# Patient Record
Sex: Male | Born: 1955 | Race: White | Hispanic: No | Marital: Married | Smoking: Former smoker
Health system: Southern US, Community
[De-identification: ages and names within clinical notes are randomized; demographics above are authoritative.]

## PROBLEM LIST (undated history)

## (undated) DIAGNOSIS — E079 Disorder of thyroid, unspecified: Secondary | ICD-10-CM

## (undated) DIAGNOSIS — F419 Anxiety disorder, unspecified: Secondary | ICD-10-CM

## (undated) DIAGNOSIS — N400 Enlarged prostate without lower urinary tract symptoms: Secondary | ICD-10-CM

---

## 2005-02-04 ENCOUNTER — Ambulatory Visit: Payer: Self-pay | Admitting: Internal Medicine

## 2006-03-13 ENCOUNTER — Ambulatory Visit: Payer: Self-pay | Admitting: Internal Medicine

## 2006-03-13 LAB — CONVERTED CEMR LAB: PSA: 1.31 ng/mL

## 2007-07-20 ENCOUNTER — Ambulatory Visit: Payer: Self-pay | Admitting: Internal Medicine

## 2007-07-21 ENCOUNTER — Encounter: Payer: Self-pay | Admitting: Internal Medicine

## 2007-11-04 ENCOUNTER — Encounter: Payer: Self-pay | Admitting: Internal Medicine

## 2008-06-19 ENCOUNTER — Telehealth (INDEPENDENT_AMBULATORY_CARE_PROVIDER_SITE_OTHER): Payer: Self-pay | Admitting: *Deleted

## 2008-08-01 ENCOUNTER — Ambulatory Visit: Payer: Self-pay | Admitting: Internal Medicine

## 2008-08-01 LAB — CONVERTED CEMR LAB
AST: 22 units/L (ref 0–37)
Basophils Absolute: 0 10*3/uL (ref 0.0–0.1)
Basophils Relative: 0.9 % (ref 0.0–3.0)
Chloride: 111 meq/L (ref 96–112)
Cholesterol: 204 mg/dL (ref 0–200)
Creatinine, Ser: 0.9 mg/dL (ref 0.4–1.5)
Direct LDL: 146.6 mg/dL
Eosinophils Absolute: 0.2 10*3/uL (ref 0.0–0.7)
GFR calc Af Amer: 114 mL/min
GFR calc non Af Amer: 95 mL/min
HCT: 42.5 % (ref 39.0–52.0)
HDL: 31.6 mg/dL — ABNORMAL LOW (ref >39.0)
Hemoglobin, Urine: NEGATIVE
Ketones, ur: NEGATIVE mg/dL
MCHC: 34.4 g/dL (ref 30.0–36.0)
MCV: 91.3 fL (ref 78.0–100.0)
Monocytes Absolute: 0.4 10*3/uL (ref 0.1–1.0)
Neutrophils Relative %: 49.8 % (ref 43.0–77.0)
PSA: 2.83 ng/mL (ref 0.10–4.00)
Platelets: 206 10*3/uL (ref 150–400)
RBC: 4.65 M/uL (ref 4.22–5.81)
TSH: 5.95 microintl units/mL — ABNORMAL HIGH (ref 0.35–5.50)
Total Bilirubin: 0.9 mg/dL (ref 0.3–1.2)
Triglycerides: 91 mg/dL (ref 0–149)
Urine Glucose: NEGATIVE mg/dL
Urobilinogen, UA: 0.2 (ref 0.0–1.0)
VLDL: 18 mg/dL (ref 0–40)

## 2008-08-08 ENCOUNTER — Ambulatory Visit: Payer: Self-pay | Admitting: Internal Medicine

## 2008-08-08 DIAGNOSIS — E785 Hyperlipidemia, unspecified: Secondary | ICD-10-CM | POA: Insufficient documentation

## 2008-08-08 DIAGNOSIS — E039 Hypothyroidism, unspecified: Secondary | ICD-10-CM

## 2008-09-19 ENCOUNTER — Telehealth (INDEPENDENT_AMBULATORY_CARE_PROVIDER_SITE_OTHER): Payer: Self-pay | Admitting: *Deleted

## 2008-09-19 ENCOUNTER — Ambulatory Visit: Payer: Self-pay | Admitting: Internal Medicine

## 2008-09-19 LAB — CONVERTED CEMR LAB
Cholesterol: 185 mg/dL (ref 0–200)
LDL Cholesterol: 131 mg/dL — ABNORMAL HIGH (ref 0–99)
Triglycerides: 82 mg/dL (ref 0–149)

## 2009-08-23 ENCOUNTER — Ambulatory Visit: Payer: Self-pay | Admitting: Internal Medicine

## 2009-08-23 LAB — CONVERTED CEMR LAB
ALT: 35 units/L (ref 0–53)
AST: 32 units/L (ref 0–37)
BUN: 15 mg/dL (ref 6–23)
Basophils Relative: 0.6 % (ref 0.0–3.0)
Bilirubin Urine: NEGATIVE
Bilirubin, Direct: 0.1 mg/dL (ref 0.0–0.3)
CO2: 31 meq/L (ref 19–32)
Chloride: 106 meq/L (ref 96–112)
Eosinophils Relative: 6.3 % — ABNORMAL HIGH (ref 0.0–5.0)
Glucose, Bld: 89 mg/dL (ref 70–99)
HDL: 45.1 mg/dL (ref >39.00)
Hemoglobin, Urine: NEGATIVE
LDL Cholesterol: 142 mg/dL — ABNORMAL HIGH (ref 0–99)
Lymphocytes Relative: 36.6 % (ref 12.0–46.0)
Monocytes Absolute: 0.4 10*3/uL (ref 0.1–1.0)
Neutrophils Relative %: 46.9 % (ref 43.0–77.0)
PSA: 1.52 ng/mL (ref 0.10–4.00)
Platelets: 199 10*3/uL (ref 150.0–400.0)
Potassium: 4.6 meq/L (ref 3.5–5.1)
RBC: 4.82 M/uL (ref 4.22–5.81)
Sodium: 141 meq/L (ref 135–145)
Specific Gravity, Urine: 1.015 (ref 1.000–1.030)
TSH: 4.16 microintl units/mL (ref 0.35–5.50)
Total Bilirubin: 0.9 mg/dL (ref 0.3–1.2)
Total CHOL/HDL Ratio: 4
Total Protein, Urine: NEGATIVE mg/dL
Urobilinogen, UA: 0.2 (ref 0.0–1.0)
VLDL: 12.4 mg/dL (ref 0.0–40.0)
WBC: 4.3 10*3/uL — ABNORMAL LOW (ref 4.5–10.5)
pH: 6.5 (ref 5.0–8.0)

## 2009-08-30 ENCOUNTER — Ambulatory Visit: Payer: Self-pay | Admitting: Internal Medicine

## 2010-08-26 ENCOUNTER — Encounter: Payer: Self-pay | Admitting: Family Medicine

## 2010-08-26 ENCOUNTER — Ambulatory Visit: Payer: Self-pay | Admitting: Family Medicine

## 2010-08-26 LAB — CONVERTED CEMR LAB
Glucose, Urine, Semiquant: NEGATIVE
Nitrite: NEGATIVE
Protein, U semiquant: NEGATIVE
Urobilinogen, UA: 0.2
WBC Urine, dipstick: NEGATIVE

## 2010-08-28 LAB — CONVERTED CEMR LAB
ALT: 19 units/L (ref 0–53)
AST: 19 units/L (ref 0–37)
Albumin: 4.1 g/dL (ref 3.5–5.2)
Alkaline Phosphatase: 58 units/L (ref 39–117)
Eosinophils Relative: 6 % — ABNORMAL HIGH (ref 0.0–5.0)
GFR calc non Af Amer: 98.53 mL/min (ref >60)
HCT: 41.3 % (ref 39.0–52.0)
Hemoglobin: 14.2 g/dL (ref 13.0–17.0)
LDL Cholesterol: 120 mg/dL — ABNORMAL HIGH (ref 0–99)
Lymphocytes Relative: 36.4 % (ref 12.0–46.0)
Lymphs Abs: 1.4 10*3/uL (ref 0.7–4.0)
Monocytes Relative: 9.2 % (ref 3.0–12.0)
Neutro Abs: 1.8 10*3/uL (ref 1.4–7.7)
Potassium: 4.2 meq/L (ref 3.5–5.1)
Sodium: 140 meq/L (ref 135–145)
TSH: 2.84 microintl units/mL (ref 0.35–5.50)
Total Bilirubin: 0.4 mg/dL (ref 0.3–1.2)
Total CHOL/HDL Ratio: 4
VLDL: 16 mg/dL (ref 0.0–40.0)
WBC: 3.9 10*3/uL — ABNORMAL LOW (ref 4.5–10.5)

## 2010-09-17 ENCOUNTER — Telehealth: Payer: Self-pay | Admitting: Family Medicine

## 2010-12-31 NOTE — Progress Notes (Signed)
Summary: REFILL REQUEST  Phone Note Refill Request Message from:  Patient on September 17, 2010 10:07 AM  Refills Requested: Medication #1:  LEVOTHYROXINE SODIUM 88 MCG TABS 1 by mouth once daily.   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  Pt states that the last Rx that he received on 9/26 was sent to the wrong pharmacy?[facility]... Pt adv he uses [facility].    Initial call taken by: Debbra Riding,  September 17, 2010 10:07 AM    Prescriptions: LEVOTHYROXINE SODIUM 88 MCG TABS (LEVOTHYROXINE SODIUM) 1 by mouth once daily  #100 x 3   Entered by:   Kern Reap CMA (AAMA)   Authorized by:   Roderick Pee MD   Signed by:   Kern Reap CMA (AAMA) on 09/17/2010   Method used:   Electronically to        [facility]  [phone]* (retail)       [address]       [place], [place]  [address]       Ph: [phone] or [phone]       Fax: [phone]   RxID:   [id number]

## 2010-12-31 NOTE — Assessment & Plan Note (Signed)
Summary: BRAND NEW PT/TO EST/PT REQ CPX/PT FASTING/PER DR Christyana Corwin/CJR   Vital Signs:  Patient profile:   55 year old male Height:      69.5 inches Weight:      174 pounds BMI:     25.42 Temp:     98.0 degrees F oral BP sitting:   108 / 78  (left arm) Cuff size:   regular  Vitals Entered By: Kern Reap CMA Duncan Dull) (August 26, 2010 9:40 AM) CC: new to establish  Is Patient Diabetic? No Pain Assessment Patient in pain? no        CC:  new to establish .  History of Present Illness: Raymond Morrow is a 55 year old, married male, nonsmoker, who comes in today as a new patient having transferred here from the [facility] office for a general physical examination because of underlying hypothyroidism.  He had a history of hyperthyroidism.  He was treated with RAI and is on thyroid 88 micrograms daily.  He's here for annual checkup.  He has recurrent fungal infections in the groin.  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  It always clears up with over-the-counter medication, but then comes back once or twice a year.  He has seen a dermatologist, who told him the same thing.  OTC Rx  Review of systems negative except is not get routine dental care.  Tetanus booster unknown.  Will give him a booster today.  He is a personal friend of Geophysicist/field seismologist & Management  Hep-HIV-STD-Contraception     Dental Visit-last 6 months no  Allergies (verified): No Known Drug Allergies  Past History:  Past medical, surgical, family and social histories (including risk factors) reviewed, and no changes noted (except as noted below).  Past Medical History: Reviewed history from 08/08/2008 and no changes required. 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 Inguinal herniorrhaphy - right  Family History: Reviewed history from 08/08/2008 and  no changes required. sister with hyperthyrodism mother with DM brother deceased - AIDS  Social History: Reviewed history from 08/08/2008 and no changes required. Married self-employed Company secretary Former Smoker Alcohol use-yes 3 children Dental Care w/in 6 mos.:  no  Review of Systems      See HPI  Physical Exam  General:  Well-developed,well-nourished,in no acute distress; alert,appropriate and cooperative throughout examination Head:  Normocephalic and atraumatic without obvious abnormalities. No apparent alopecia or balding. Eyes:  No corneal or conjunctival inflammation noted. EOMI. Perrla. Funduscopic exam benign, without hemorrhages, exudates or papilledema. Vision grossly normal. Ears:  External ear exam shows no significant lesions or deformities.  Otoscopic examination reveals clear canals, tympanic membranes are intact bilaterally without bulging, retraction, inflammation or discharge. Hearing is grossly normal bilaterally. Nose:  External nasal examination shows no deformity or inflammation. Nasal mucosa are pink and moist without lesions or exudates. Mouth:  Oral mucosa and oropharynx without lesions or exudates.  Teeth in good repair. Neck:  No deformities, masses, or tenderness noted. Chest Wall:  No deformities, masses, tenderness or gynecomastia noted. Breasts:  No masses or gynecomastia noted Lungs:  Normal respiratory effort, chest expands symmetrically. Lungs are clear to auscultation, no crackles or wheezes. Heart:  Normal rate and regular rhythm. S1 and S2 normal without gallop, murmur, click, rub or other extra sounds. Abdomen:  Bowel sounds positive,abdomen soft and non-tender without masses, organomegaly or hernias noted. Rectal:  No external abnormalities noted.  Normal sphincter tone. No rectal masses or tenderness. Genitalia:  Testes bilaterally descended without nodularity, tenderness or masses. No scrotal masses or lesions. No penis lesions or urethral  discharge. Prostate:  Prostate gland firm and smooth, no enlargement, nodularity, tenderness, mass, asymmetry or induration. Msk:  No deformity or scoliosis noted of thoracic or lumbar spine.   Pulses:  R and L carotid,radial,femoral,dorsalis pedis and posterior tibial pulses are full and equal bilaterally Extremities:  No clubbing, cyanosis, edema, or deformity noted with normal full range of motion of all joints.   Neurologic:  No cranial nerve deficits noted. Station and gait are normal. Plantar reflexes are down-going bilaterally. DTRs are symmetrical throughout. Sensory, motor and coordinative functions appear intact. Skin:  total body skin exam normal except for scar right groin from previous hernia surgery Cervical Nodes:  No lymphadenopathy noted Axillary Nodes:  No palpable lymphadenopathy Inguinal Nodes:  No significant adenopathy Psych:  Cognition and judgment appear intact. Alert and cooperative with normal attention span and concentration. No apparent delusions, illusions, hallucinations   Impression & Recommendations:  Problem # 1:  PREVENTIVE HEALTH CARE (ICD-V70.0) Assessment New  Orders: Venipuncture (16109) TLB-Lipid Panel (80061-LIPID) TLB-BMP (Basic Metabolic Panel-BMET) (80048-METABOL) TLB-CBC Platelet - w/Differential (85025-CBCD) TLB-Hepatic/Liver Function Pnl (80076-HEPATIC) TLB-TSH (Thyroid Stimulating Hormone) (84443-TSH) TLB-PSA (Prostate Specific Antigen) (84153-PSA) Prescription Created Electronically [id number]) UA Dipstick w/o Micro (automated)  (81003) EKG w/ Interpretation (93000) Specimen Handling (09811)  Problem # 2:  HYPOTHYROIDISM (ICD-244.9) Assessment: New  His updated medication list for this problem includes:    Levothyroxine Sodium 88 Mcg Tabs (Levothyroxine sodium) .Marland Kitchen... 1 by mouth once daily  His updated medication list for this problem includes:    Levothyroxine Sodium 88 Mcg Tabs (Levothyroxine sodium) .Marland Kitchen... 1 by mouth once  daily  Orders: Venipuncture (91478) TLB-Lipid Panel (80061-LIPID) TLB-BMP (Basic Metabolic Panel-BMET) (80048-METABOL) TLB-CBC Platelet - w/Differential (85025-CBCD) TLB-Hepatic/Liver Function Pnl (80076-HEPATIC) TLB-TSH (Thyroid Stimulating Hormone) (84443-TSH) TLB-PSA (Prostate Specific Antigen) (84153-PSA) Prescription Created Electronically [phone]) UA Dipstick w/o Micro (automated)  (81003)  Complete Medication List: 1)  Levothyroxine Sodium 88 Mcg Tabs (Levothyroxine sodium) .Marland Kitchen.. 1 by mouth once daily  Other Orders: Tdap => 34yrs IM (13086) Admin 1st Vaccine (57846)  Patient Instructions: 1)  Please schedule a follow-up appointment in 1 year. 2)  Take an Aspirin every day. 3)  Motrin 600 mg twice daily with food for joint pain. 4)  We will call you when we get your lab work back Prescriptions: LEVOTHYROXINE SODIUM 88 MCG TABS (LEVOTHYROXINE SODIUM) 1 by mouth once daily  #100 x 3   Entered and Authorized by:   Roderick Pee MD   Signed by:   Roderick Pee MD on 08/26/2010   Method used:   Electronically to        [facility]. [phone]* (retail)       [phone] [address]       [place], [place]  [address]       Ph: [phone]       Fax: [phone]   RxID:   [id number]    Immunizations Administered:  Tetanus Vaccine:    Vaccine Type: Tdap    Site: left deltoid    Mfr: GlaxoSmithKline    Dose: 0.5 ml    Route: IM    Given by: Kern Reap CMA (AAMA)    Exp. Date: 09/19/2012    Lot #: [id number]    Physician counseled: yes   Laboratory Results  Urine Tests    Routine Urinalysis   Color: yellow Appearance: Clear Glucose: negative   (Normal Range: Negative) Bilirubin: negative   (Normal Range: Negative) Ketone: negative   (Normal Range: Negative) Spec. Gravity: <1.005   (Normal Range: 1.003-1.035) Blood: negative   (Normal Range: Negative) pH: 5.0   (Normal Range: 5.0-8.0) Protein: negative   (Normal Range:  Negative) Urobilinogen: 0.2   (Normal Range: 0-1) Nitrite: negative   (Normal Range: Negative) Leukocyte Esterace: negative   (Normal Range: Negative)    Comments: Rita Ohara  August 26, 2010 11:07 AM

## 2011-10-16 ENCOUNTER — Telehealth: Payer: Self-pay | Admitting: Family Medicine

## 2011-10-16 MED ORDER — LEVOTHYROXINE SODIUM 88 MCG PO CAPS: 88.0000 ug | ORAL_CAPSULE | Freq: Every day | ORAL | Status: DC

## 2011-10-16 NOTE — Telephone Encounter (Signed)
Pt requesting refill on LEVOTHYROXINE SODIUM 88 MCG TABS 1 by mouth once daily. Pt is scheduled 11/17/11 for cpx but will be out of meds before then   [facility]

## 2011-10-18 ENCOUNTER — Other Ambulatory Visit: Payer: Self-pay | Admitting: Family Medicine

## 2011-10-20 ENCOUNTER — Telehealth: Payer: Self-pay | Admitting: Family Medicine

## 2011-10-20 NOTE — Telephone Encounter (Signed)
FYI... Pt wants his pharmacy to be [facility] [address] not [facility]

## 2011-10-20 NOTE — Telephone Encounter (Signed)
noted 

## 2011-11-06 ENCOUNTER — Other Ambulatory Visit (INDEPENDENT_AMBULATORY_CARE_PROVIDER_SITE_OTHER): Payer: BC Managed Care – PPO

## 2011-11-06 DIAGNOSIS — Z0000 Encounter for general adult medical examination without abnormal findings: Secondary | ICD-10-CM

## 2011-11-06 LAB — LIPID PANEL
LDL Cholesterol: 130 mg/dL — ABNORMAL HIGH (ref 0–99)
Total CHOL/HDL Ratio: 4
Triglycerides: 42 mg/dL (ref 0.0–149.0)
VLDL: 8.4 mg/dL (ref 0.0–40.0)

## 2011-11-06 LAB — BASIC METABOLIC PANEL
Calcium: 8.9 mg/dL (ref 8.4–10.5)
Creatinine, Ser: 0.9 mg/dL (ref 0.4–1.5)
GFR: 94.28 mL/min (ref >60.00)

## 2011-11-06 LAB — POCT URINALYSIS DIPSTICK
Bilirubin, UA: NEGATIVE
Glucose, UA: NEGATIVE
Leukocytes, UA: NEGATIVE
Nitrite, UA: NEGATIVE

## 2011-11-06 LAB — CBC WITH DIFFERENTIAL/PLATELET
Basophils Relative: 1 % (ref 0.0–3.0)
Eosinophils Relative: 4.7 % (ref 0.0–5.0)
Lymphocytes Relative: 35.4 % (ref 12.0–46.0)
Neutrophils Relative %: 50.9 % (ref 43.0–77.0)
Platelets: 188 10*3/uL (ref 150.0–400.0)
RBC: 4.39 Mil/uL (ref 4.22–5.81)
WBC: 3.4 10*3/uL — ABNORMAL LOW (ref 4.5–10.5)

## 2011-11-06 LAB — HEPATIC FUNCTION PANEL
ALT: 25 U/L (ref 0–53)
Alkaline Phosphatase: 58 U/L (ref 39–117)
Bilirubin, Direct: 0.1 mg/dL (ref 0.0–0.3)
Total Bilirubin: 0.6 mg/dL (ref 0.3–1.2)
Total Protein: 6.6 g/dL (ref 6.0–8.3)

## 2011-11-17 ENCOUNTER — Encounter: Payer: Self-pay | Admitting: Family Medicine

## 2011-11-17 ENCOUNTER — Ambulatory Visit (INDEPENDENT_AMBULATORY_CARE_PROVIDER_SITE_OTHER): Payer: BC Managed Care – PPO | Admitting: Family Medicine

## 2011-11-17 VITALS — BP 102/64 | Temp 98.0°F | Ht 69.5 in | Wt 173.0 lb

## 2011-11-17 DIAGNOSIS — E039 Hypothyroidism, unspecified: Secondary | ICD-10-CM

## 2011-11-17 DIAGNOSIS — Z0000 Encounter for general adult medical examination without abnormal findings: Secondary | ICD-10-CM

## 2011-11-17 MED ORDER — LEVOTHYROXINE SODIUM 88 MCG PO TABS: 88.0000 ug | ORAL_TABLET | Freq: Every day | ORAL | Status: DC

## 2011-11-17 NOTE — Progress Notes (Signed)
  Subjective:    Patient ID: Raymond Morrow, male    DOB: 12-21-1955, 55 y.o.   MRN: [id number]  HPI Raymond Morrow is a 55 year old, married man nonsmoker, who comes in today for a general physical examination because of a history of hypo-thyroidism.  A fairly taking Synthroid 88 mcg daily.  Tetanus booster 2011, he declines the flu shot   Review of Systems  Constitutional: Negative.   HENT: Positive for congestion.   Eyes: Negative.   Respiratory: Negative.   Cardiovascular: Negative.   Gastrointestinal: Negative.   Genitourinary: Negative.   Musculoskeletal: Negative.   Skin: Negative.   Neurological: Negative.   Hematological: Negative.   Psychiatric/Behavioral: Negative.        Objective:   Physical Exam  Constitutional: He is oriented to person, place, and time. He appears well-developed and well-nourished.  HENT:  Head: Normocephalic and atraumatic.  Right Ear: External ear normal.  Left Ear: External ear normal.  Nose: Nose normal.  Mouth/Throat: Oropharynx is clear and moist.  Eyes: Conjunctivae and EOM are normal. Pupils are equal, round, and reactive to light.  Neck: Normal range of motion. Neck supple. No JVD present. No tracheal deviation present. No thyromegaly present.  Cardiovascular: Normal rate, regular rhythm, normal heart sounds and intact distal pulses.  Exam reveals no gallop and no friction rub.   No murmur heard. Pulmonary/Chest: Effort normal and breath sounds normal. No stridor. No respiratory distress. He has no wheezes. He has no rales. He exhibits no tenderness.  Abdominal: Soft. Bowel sounds are normal. He exhibits no distension and no mass. There is no tenderness. There is no rebound and no guarding.  Genitourinary: Rectum normal, prostate normal and penis normal. Guaiac negative stool. No penile tenderness.  Musculoskeletal: Normal range of motion. He exhibits no edema and no tenderness.  Lymphadenopathy:    He has no cervical adenopathy.    Neurological: He is alert and oriented to person, place, and time. He has normal reflexes. No cranial nerve deficit. He exhibits normal muscle tone.  Skin: Skin is warm and dry. No rash noted. No erythema. No pallor.  Psychiatric: He has a normal mood and affect. His behavior is normal. Judgment and thought content normal.          Assessment & Plan:  Healthy male   Hypothyroidism plan continue Synthroid.  Follow-up in one year.  Hearing loss.  Recommend audiogram

## 2011-11-17 NOTE — Patient Instructions (Signed)
Continue your current thyroid dose.  I would recommend u  [facility]  Return p.r.n. One year

## 2012-12-09 ENCOUNTER — Other Ambulatory Visit: Payer: Self-pay | Admitting: Family Medicine

## 2013-04-24 ENCOUNTER — Other Ambulatory Visit: Payer: Self-pay | Admitting: Family Medicine

## 2013-05-09 ENCOUNTER — Ambulatory Visit (INDEPENDENT_AMBULATORY_CARE_PROVIDER_SITE_OTHER): Payer: BC Managed Care – PPO | Admitting: Family Medicine

## 2013-05-09 ENCOUNTER — Encounter: Payer: Self-pay | Admitting: Family Medicine

## 2013-05-09 VITALS — BP 110/70 | Temp 98.4°F | Wt 175.0 lb

## 2013-05-09 DIAGNOSIS — E039 Hypothyroidism, unspecified: Secondary | ICD-10-CM

## 2013-05-09 DIAGNOSIS — E785 Hyperlipidemia, unspecified: Secondary | ICD-10-CM

## 2013-05-09 LAB — CBC WITH DIFFERENTIAL/PLATELET
Basophils Absolute: 0 10*3/uL (ref 0.0–0.1)
Lymphocytes Relative: 14.9 % (ref 12.0–46.0)
Lymphs Abs: 0.9 10*3/uL (ref 0.7–4.0)
Monocytes Relative: 12.9 % — ABNORMAL HIGH (ref 3.0–12.0)
Platelets: 209 10*3/uL (ref 150.0–400.0)
RDW: 13.2 % (ref 11.5–14.6)

## 2013-05-09 LAB — BASIC METABOLIC PANEL
CO2: 28 mEq/L (ref 19–32)
Chloride: 106 mEq/L (ref 96–112)
Creatinine, Ser: 0.8 mg/dL (ref 0.4–1.5)
Glucose, Bld: 80 mg/dL (ref 70–99)

## 2013-05-09 LAB — POCT URINALYSIS DIPSTICK
Bilirubin, UA: NEGATIVE
Leukocytes, UA: NEGATIVE
Nitrite, UA: NEGATIVE
Urobilinogen, UA: 0.2
pH, UA: 5.5

## 2013-05-09 LAB — HEPATIC FUNCTION PANEL
ALT: 22 U/L (ref 0–53)
AST: 18 U/L (ref 0–37)
Albumin: 3.8 g/dL (ref 3.5–5.2)

## 2013-05-09 LAB — PSA: PSA: 1.96 ng/mL (ref 0.10–4.00)

## 2013-05-09 LAB — TSH: TSH: 8.93 u[IU]/mL — ABNORMAL HIGH (ref 0.35–5.50)

## 2013-05-09 LAB — LIPID PANEL
HDL: 40.8 mg/dL (ref >39.00)
Triglycerides: 184 mg/dL — ABNORMAL HIGH (ref 0.0–149.0)

## 2013-05-09 MED ORDER — LEVOTHYROXINE SODIUM 88 MCG PO TABS: ORAL_TABLET | ORAL | Status: DC

## 2013-05-09 NOTE — Progress Notes (Signed)
  Subjective:    Patient ID: Raymond Morrow, male    DOB: 1956-09-24, 58 y.o.   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 He recently got back from the [place] and has had 3-4 loose bowel movements over the last 2 days. He has no fever chills or vomiting   Review of Systems    well-developed review of systems negative Objective:   Physical Exam Well-developed well nourished male no acute distress examination neck shows a nonpalpable thyroid       Assessment & Plan:  History of hypothyroidism corrected with Synthroid 88 mcg daily check labs and set up CPX

## 2013-05-09 NOTE — Patient Instructions (Signed)
Continue your current dose of Synthroid 88 mcg daily  Labs today,,,,,,,,,,,,, we will call you the report tomorrow  Set up a physical exam sometime in July,,,,,,,,,,,,,,,,,,,,,,,, all your lab work today so you won't need any lab work prior your physical

## 2013-06-13 ENCOUNTER — Encounter: Payer: Self-pay | Admitting: Family Medicine

## 2013-06-13 ENCOUNTER — Ambulatory Visit (INDEPENDENT_AMBULATORY_CARE_PROVIDER_SITE_OTHER): Payer: BC Managed Care – PPO | Admitting: Family Medicine

## 2013-06-13 ENCOUNTER — Telehealth: Payer: Self-pay | Admitting: Family Medicine

## 2013-06-13 VITALS — BP 110/80 | Temp 98.3°F | Ht 70.25 in | Wt 176.0 lb

## 2013-06-13 DIAGNOSIS — E039 Hypothyroidism, unspecified: Secondary | ICD-10-CM

## 2013-06-13 DIAGNOSIS — Z0000 Encounter for general adult medical examination without abnormal findings: Secondary | ICD-10-CM

## 2013-06-13 DIAGNOSIS — H833X9 Noise effects on inner ear, unspecified ear: Secondary | ICD-10-CM | POA: Insufficient documentation

## 2013-06-13 DIAGNOSIS — H833X3 Noise effects on inner ear, bilateral: Secondary | ICD-10-CM

## 2013-06-13 DIAGNOSIS — N400 Enlarged prostate without lower urinary tract symptoms: Secondary | ICD-10-CM | POA: Insufficient documentation

## 2013-06-13 DIAGNOSIS — E785 Hyperlipidemia, unspecified: Secondary | ICD-10-CM

## 2013-06-13 DIAGNOSIS — F411 Generalized anxiety disorder: Secondary | ICD-10-CM

## 2013-06-13 MED ORDER — LEVOTHYROXINE SODIUM 100 MCG PO TABS: 100.0000 ug | ORAL_TABLET | Freq: Every day | ORAL | Status: DC

## 2013-06-13 NOTE — Patient Instructions (Addendum)
Increase the Synthroid to 100 mcg daily  Followup TSH level in 3 months  Continue good health habits  Return in one year for general physical examination  In the morning clean your eyes with a warm washcloth and use clear eyes........ One drop in each eye 3 times daily when necessary  I would recommend [facility] for hearing aid evaluation

## 2013-06-13 NOTE — Progress Notes (Signed)
  Subjective:    Patient ID: Raymond Morrow, male    DOB: Dec 28, 1955, 57 y.o.   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 His TSH level is 8.93. He states he's taking his medication on a daily basis and has not skipped a dose. We will therefore increase his Synthroid to 100 mcg daily  He gets routine eye care, dental care, colonoscopy 2 years ago normal, vaccinations up-to-date  He says he has trouble with mucus in his eyes. He may have some underlying allergy  He's also noticed decreased hearing. 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 I recommended [facility] for hearing aid evaluation  Family history unchanged mother is 25 alive and well dad died in his 31 from alcoholism 2 brothers and 2 sisters one brother died of AIDS the other 3 are in excellent health   Review of Systems  Constitutional: Negative.   HENT: Negative.   Eyes: Negative.   Respiratory: Negative.   Cardiovascular: Negative.   Gastrointestinal: Negative.   Genitourinary: Negative.   Musculoskeletal: Negative.   Skin: Negative.   Neurological: Negative.   Psychiatric/Behavioral: Negative.        Objective:   Physical Exam  Constitutional: He is oriented to person, place, and time. He appears well-developed and well-nourished.  HENT:  Head: Normocephalic and atraumatic.  Right Ear: External ear normal.  Left Ear: External ear normal.  Nose: Nose normal.  Mouth/Throat: Oropharynx is clear and moist.  Eyes: Conjunctivae and EOM are normal. Pupils are equal, round, and reactive to light.  Neck: Normal range of motion. Neck supple. No JVD present. No tracheal deviation present. No thyromegaly present.  Cardiovascular: Normal rate, regular rhythm, normal heart sounds and intact distal pulses.  Exam  reveals no gallop and no friction rub.   No murmur heard. Pulmonary/Chest: Effort normal and breath sounds normal. No stridor. No respiratory distress. He has no wheezes. He has no rales. He exhibits no tenderness.  Abdominal: Soft. Bowel sounds are normal. He exhibits no distension and no mass. There is no tenderness. There is no rebound and no guarding.  Genitourinary: Rectum normal and penis normal. Guaiac negative stool. No penile tenderness.  1+ BPH symmetrical  Musculoskeletal: Normal range of motion. He exhibits no edema and no tenderness.  Lymphadenopathy:    He has no cervical adenopathy.  Neurological: He is alert and oriented to person, place, and time. He has normal reflexes. No cranial nerve deficit. He exhibits normal muscle tone.  Skin: Skin is warm and dry. No rash noted. No erythema. No pallor.  Total body skin exam normal  Psychiatric: He has a normal mood and affect. His behavior is normal. Judgment and thought content normal.          Assessment & Plan:  Healthy male  History of hypothyroidism increase Synthroid 100 mcg daily  Allergic eyes prescribe treatment program  Hearing loss recommended hearing aid evaluation

## 2013-06-13 NOTE — Telephone Encounter (Signed)
Left detailed message on machine for patient 

## 2013-06-13 NOTE — Telephone Encounter (Signed)
In the future usually a complete 12 hour fast you can add nothing to your drink after midnight and come in a.m. Or you can have a light breakfast skip lunch and come in the afternoon

## 2013-06-13 NOTE — Telephone Encounter (Signed)
Pt states he did NOT have to fast this year for his CPX. Pt would like to know if he still would not have to fast next year. Pls advise.

## 2013-08-19 ENCOUNTER — Ambulatory Visit: Payer: BC Managed Care – PPO | Admitting: Internal Medicine

## 2013-08-19 ENCOUNTER — Ambulatory Visit (INDEPENDENT_AMBULATORY_CARE_PROVIDER_SITE_OTHER): Payer: BC Managed Care – PPO | Admitting: Family Medicine

## 2013-08-19 ENCOUNTER — Encounter: Payer: Self-pay | Admitting: Family Medicine

## 2013-08-19 VITALS — BP 110/70 | HR 60 | Wt 171.0 lb

## 2013-08-19 DIAGNOSIS — T22211A Burn of second degree of right forearm, initial encounter: Secondary | ICD-10-CM

## 2013-08-19 DIAGNOSIS — T22219A Burn of second degree of unspecified forearm, initial encounter: Secondary | ICD-10-CM

## 2013-08-19 MED ORDER — CEPHALEXIN 500 MG PO CAPS: 500.0000 mg | ORAL_CAPSULE | Freq: Three times a day (TID) | ORAL | Status: DC

## 2013-08-19 NOTE — Patient Instructions (Addendum)
Cellulitis Cellulitis is an infection of the skin and the tissue beneath it. The infected area is usually red and tender. Cellulitis occurs most often in the arms and lower legs.  CAUSES  Cellulitis is caused by bacteria that enter the skin through cracks or cuts in the skin. The most common types of bacteria that cause cellulitis are Staphylococcus and Streptococcus. SYMPTOMS   Redness and warmth.  Swelling.  Tenderness or pain.  Fever. DIAGNOSIS  Your caregiver can usually determine what is wrong based on a physical exam. Blood tests may also be done. TREATMENT  Treatment usually involves taking an antibiotic medicine. HOME CARE INSTRUCTIONS   Take your antibiotics as directed. Finish them even if you start to feel better.  Keep the infected arm or leg elevated to reduce swelling.  Apply a warm cloth to the affected area up to 4 times per day to relieve pain.  Only take over-the-counter or prescription medicines for pain, discomfort, or fever as directed by your caregiver.  Keep all follow-up appointments as directed by your caregiver. SEEK MEDICAL CARE IF:   You notice red streaks coming from the infected area.  Your red area gets larger or turns dark in color.  Your bone or joint underneath the infected area becomes painful after the skin has healed.  Your infection returns in the same area or another area.  You notice a swollen bump in the infected area.  You develop new symptoms. SEEK IMMEDIATE MEDICAL CARE IF:   You have a fever.  You feel very sleepy.  You develop vomiting or diarrhea.  You have a general ill feeling (malaise) with muscle aches and pains. MAKE SURE YOU:   Understand these instructions.  Will watch your condition.  Will get help right away if you are not doing well or get worse. Document Released: 08/27/2005 Document Revised: 05/18/2012 Document Reviewed: 02/02/2012 Exit[employer] Patient Information 2014 [employer].  
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
## 2013-09-13 ENCOUNTER — Other Ambulatory Visit (INDEPENDENT_AMBULATORY_CARE_PROVIDER_SITE_OTHER): Payer: BC Managed Care – PPO

## 2013-09-13 DIAGNOSIS — E039 Hypothyroidism, unspecified: Secondary | ICD-10-CM

## 2013-10-06 ENCOUNTER — Other Ambulatory Visit: Payer: Self-pay

## 2014-05-26 ENCOUNTER — Other Ambulatory Visit: Payer: Self-pay | Admitting: Family Medicine

## 2014-06-14 ENCOUNTER — Encounter: Payer: Self-pay | Admitting: Family Medicine

## 2014-06-14 ENCOUNTER — Ambulatory Visit (INDEPENDENT_AMBULATORY_CARE_PROVIDER_SITE_OTHER): Payer: BC Managed Care – PPO | Admitting: Family Medicine

## 2014-06-14 VITALS — BP 120/80 | Temp 97.8°F | Ht 70.25 in | Wt 173.0 lb

## 2014-06-14 DIAGNOSIS — Z0000 Encounter for general adult medical examination without abnormal findings: Secondary | ICD-10-CM

## 2014-06-14 DIAGNOSIS — E039 Hypothyroidism, unspecified: Secondary | ICD-10-CM

## 2014-06-14 DIAGNOSIS — N400 Enlarged prostate without lower urinary tract symptoms: Secondary | ICD-10-CM

## 2014-06-14 LAB — LIPID PANEL
Cholesterol: 189 mg/dL (ref 0–200)
HDL: 51.3 mg/dL (ref >39.00)
LDL Cholesterol: 127 mg/dL — ABNORMAL HIGH (ref 0–99)
NONHDL: 137.7
Total CHOL/HDL Ratio: 4
Triglycerides: 54 mg/dL (ref 0.0–149.0)
VLDL: 10.8 mg/dL (ref 0.0–40.0)

## 2014-06-14 LAB — POCT URINALYSIS DIPSTICK
Bilirubin, UA: NEGATIVE
Glucose, UA: NEGATIVE
Ketones, UA: NEGATIVE
LEUKOCYTES UA: NEGATIVE
NITRITE UA: NEGATIVE
PH UA: 5.5
PROTEIN UA: NEGATIVE
RBC UA: NEGATIVE
Spec Grav, UA: 1.015
Urobilinogen, UA: 0.2

## 2014-06-14 LAB — CBC WITH DIFFERENTIAL/PLATELET
BASOS ABS: 0 10*3/uL (ref 0.0–0.1)
Basophils Relative: 0.6 % (ref 0.0–3.0)
Eosinophils Absolute: 0.2 10*3/uL (ref 0.0–0.7)
Eosinophils Relative: 4.4 % (ref 0.0–5.0)
HEMATOCRIT: 42.7 % (ref 39.0–52.0)
Hemoglobin: 14.3 g/dL (ref 13.0–17.0)
LYMPHS ABS: 1.4 10*3/uL (ref 0.7–4.0)
Lymphocytes Relative: 38.4 % (ref 12.0–46.0)
MCHC: 33.4 g/dL (ref 30.0–36.0)
MCV: 91.9 fl (ref 78.0–100.0)
MONO ABS: 0.3 10*3/uL (ref 0.1–1.0)
Monocytes Relative: 9.5 % (ref 3.0–12.0)
NEUTROS ABS: 1.7 10*3/uL (ref 1.4–7.7)
Neutrophils Relative %: 47.1 % (ref 43.0–77.0)
Platelets: 211 10*3/uL (ref 150.0–400.0)
RBC: 4.65 Mil/uL (ref 4.22–5.81)
RDW: 13.6 % (ref 11.5–15.5)
WBC: 3.5 10*3/uL — ABNORMAL LOW (ref 4.0–10.5)

## 2014-06-14 LAB — BASIC METABOLIC PANEL
BUN: 16 mg/dL (ref 6–23)
CO2: 27 mEq/L (ref 19–32)
CREATININE: 0.9 mg/dL (ref 0.4–1.5)
Calcium: 9 mg/dL (ref 8.4–10.5)
Chloride: 104 mEq/L (ref 96–112)
GFR: 93.4 mL/min (ref >60.00)
GLUCOSE: 96 mg/dL (ref 70–99)
Potassium: 4.1 mEq/L (ref 3.5–5.1)
Sodium: 136 mEq/L (ref 135–145)

## 2014-06-14 LAB — HEPATIC FUNCTION PANEL
ALBUMIN: 4 g/dL (ref 3.5–5.2)
ALK PHOS: 53 U/L (ref 39–117)
ALT: 18 U/L (ref 0–53)
AST: 18 U/L (ref 0–37)
BILIRUBIN DIRECT: 0.1 mg/dL (ref 0.0–0.3)
Total Bilirubin: 0.7 mg/dL (ref 0.2–1.2)
Total Protein: 6.5 g/dL (ref 6.0–8.3)

## 2014-06-14 LAB — PSA: PSA: 1.29 ng/mL (ref 0.10–4.00)

## 2014-06-14 LAB — TSH: TSH: 1.88 u[IU]/mL (ref 0.35–4.50)

## 2014-06-14 MED ORDER — LEVOTHYROXINE SODIUM 100 MCG PO TABS: ORAL_TABLET | ORAL | Status: DC

## 2014-06-14 NOTE — Progress Notes (Signed)
   Subjective:    Patient ID: Raymond Morrow, male    DOB: 1956-03-03, 58 y.o.   MRN: [id number]  HPI Raymond Morrow is a 58 year old married male nonsmoker,,,,,,, went to [employer],,,,,, who comes in today for general physical examination because of a history of hypothyroidism  He's on Synthroid 100 mcg daily and doing well  He relates some difficulty in [facility] school [employer] student trouble with focus and concentration which is carried into his adult life. I offered him options of testing trial of medication etc. he's got sick about it.  He gets routine eye care, dental care, colonoscopy in his early 5850s normal, tetanus 2011   Review of Systems  Constitutional: Negative.   HENT: Negative.   Eyes: Negative.   Respiratory: Negative.   Cardiovascular: Negative.   Gastrointestinal: Negative.   Genitourinary: Negative.   Musculoskeletal: Negative.   Skin: Negative.   Neurological: Negative.   Psychiatric/Behavioral: Negative.        Objective:   Physical Exam  Nursing note and vitals reviewed. Constitutional: He is oriented to person, place, and time. He appears well-developed and well-nourished.  HENT:  Head: Normocephalic and atraumatic.  Right Ear: External ear normal.  Left Ear: External ear normal.  Nose: Nose normal.  Mouth/Throat: Oropharynx is clear and moist.  Eyes: Conjunctivae and EOM are normal. Pupils are equal, round, and reactive to light.  Neck: Normal range of motion. Neck supple. No JVD present. No tracheal deviation present. No thyromegaly present.  Cardiovascular: Normal rate, regular rhythm, normal heart sounds and intact distal pulses.  Exam reveals no gallop and no friction rub.   No murmur heard. Pulmonary/Chest: Effort normal and breath sounds normal. No stridor. No respiratory distress. He has no wheezes. He has no rales. He exhibits no tenderness.  Abdominal: Soft. Bowel sounds are normal. He exhibits no distension and no mass. There is no tenderness.  There is no rebound and no guarding.  Genitourinary: Rectum normal and penis normal. Guaiac negative stool. No penile tenderness.  2+ symmetrical nodular BPH  Musculoskeletal: Normal range of motion. He exhibits no edema and no tenderness.  Lymphadenopathy:    He has no cervical adenopathy.  Neurological: He is alert and oriented to person, place, and time. He has normal reflexes. No cranial nerve deficit. He exhibits normal muscle tone.  Skin: Skin is warm and dry. No rash noted. No erythema. No pallor.  Psychiatric: He has a normal mood and affect. His behavior is normal. Judgment and thought content normal.          Assessment & Plan:  Healthy male  Hypothyroidism continue Synthroid  BPH asymptomatic......Marland Kitchen. monitor labs  Symptoms of the adult ADD,,,,,, options explained to the patient.

## 2014-06-14 NOTE — Patient Instructions (Signed)
Continue your current medication  Followup in 1 year sooner if any problems

## 2014-06-14 NOTE — Progress Notes (Signed)
Pre visit review using our clinic review tool, if applicable. No additional management support is needed unless otherwise documented below in the visit note. 

## 2015-05-23 ENCOUNTER — Telehealth: Payer: Self-pay | Admitting: Family Medicine

## 2015-05-23 DIAGNOSIS — E039 Hypothyroidism, unspecified: Secondary | ICD-10-CM

## 2015-05-23 MED ORDER — LEVOTHYROXINE SODIUM 100 MCG PO TABS
ORAL_TABLET | ORAL | Status: DC
Start: 2015-05-23 — End: 2015-08-27

## 2015-05-23 NOTE — Telephone Encounter (Signed)
Rx sent 

## 2015-05-23 NOTE — Telephone Encounter (Signed)
Pt is aware.  

## 2015-05-23 NOTE — Telephone Encounter (Signed)
Pt has a cpx sch w/dr todd on 08-27-15. Pt needs refill on levothyroxine 100 mg #90 sent to [facility] [address]

## 2015-06-18 ENCOUNTER — Emergency Department (HOSPITAL_COMMUNITY): Payer: BLUE CROSS/BLUE SHIELD

## 2015-06-18 ENCOUNTER — Encounter (HOSPITAL_COMMUNITY): Payer: Self-pay | Admitting: Emergency Medicine

## 2015-06-18 ENCOUNTER — Emergency Department (HOSPITAL_COMMUNITY)
Admission: EM | Admit: 2015-06-18 | Discharge: 2015-06-19 | Disposition: A | Discharge status: Home / Self Care | Payer: BLUE CROSS/BLUE SHIELD | Attending: Emergency Medicine | Admitting: Emergency Medicine

## 2015-06-18 DIAGNOSIS — Z7982 Long term (current) use of aspirin: Secondary | ICD-10-CM | POA: Diagnosis not present

## 2015-06-18 DIAGNOSIS — R079 Chest pain, unspecified: Secondary | ICD-10-CM | POA: Diagnosis present

## 2015-06-18 DIAGNOSIS — Z8659 Personal history of other mental and behavioral disorders: Secondary | ICD-10-CM | POA: Diagnosis not present

## 2015-06-18 DIAGNOSIS — Z87448 Personal history of other diseases of urinary system: Secondary | ICD-10-CM | POA: Diagnosis not present

## 2015-06-18 DIAGNOSIS — Z87891 Personal history of nicotine dependence: Secondary | ICD-10-CM | POA: Diagnosis not present

## 2015-06-18 DIAGNOSIS — Z79899 Other long term (current) drug therapy: Secondary | ICD-10-CM | POA: Diagnosis not present

## 2015-06-18 DIAGNOSIS — R0789 Other chest pain: Secondary | ICD-10-CM

## 2015-06-18 DIAGNOSIS — E039 Hypothyroidism, unspecified: Secondary | ICD-10-CM | POA: Insufficient documentation

## 2015-06-18 HISTORY — DX: Anxiety disorder, unspecified: F41.9

## 2015-06-18 HISTORY — DX: Benign prostatic hyperplasia without lower urinary tract symptoms: N40.0

## 2015-06-18 HISTORY — DX: Disorder of thyroid, unspecified: E07.9

## 2015-06-18 LAB — BASIC METABOLIC PANEL
Anion gap: 7 (ref 5–15)
BUN: 11 mg/dL (ref 6–20)
CO2: 28 mmol/L (ref 22–32)
Calcium: 9.1 mg/dL (ref 8.9–10.3)
Chloride: 106 mmol/L (ref 101–111)
Creatinine, Ser: 0.99 mg/dL (ref 0.61–1.24)
GFR calc Af Amer: >60 mL/min (ref >60)
Glucose, Bld: 99 mg/dL (ref 65–99)
POTASSIUM: 4.1 mmol/L (ref 3.5–5.1)
SODIUM: 141 mmol/L (ref 135–145)

## 2015-06-18 LAB — CBC
HCT: 40 % (ref 39.0–52.0)
Hemoglobin: 13.4 g/dL (ref 13.0–17.0)
MCH: 30.5 pg (ref 26.0–34.0)
MCHC: 33.5 g/dL (ref 30.0–36.0)
MCV: 91.1 fL (ref 78.0–100.0)
Platelets: 202 10*3/uL (ref 150–400)
RBC: 4.39 MIL/uL (ref 4.22–5.81)
RDW: 12.8 % (ref 11.5–15.5)
WBC: 5.7 10*3/uL (ref 4.0–10.5)

## 2015-06-18 LAB — I-STAT TROPONIN, ED: TROPONIN I, POC: 0 ng/mL (ref 0.00–0.08)

## 2015-06-18 NOTE — ED Notes (Signed)
Pt. reports pain " pressure" across his chest radiating to both arms onset this evening , denies SOB , no nausea or diaphoresis .

## 2015-06-19 LAB — I-STAT TROPONIN, ED: TROPONIN I, POC: 0 ng/mL (ref 0.00–0.08)

## 2015-06-19 MED ORDER — MORPHINE SULFATE 4 MG/ML IJ SOLN
4.0000 mg | Freq: Once | INTRAMUSCULAR | Status: DC
Start: 2015-06-19 — End: 2015-06-19
  Filled 2015-06-19: qty 1

## 2015-06-19 MED ORDER — GI COCKTAIL ~~LOC~~
30.0000 mL | Freq: Once | ORAL | Status: AC
  Administered 2015-06-19: 30 mL via ORAL
  Filled 2015-06-19: qty 30

## 2015-06-19 MED ORDER — SODIUM CHLORIDE 0.9 % IV BOLUS (SEPSIS): 1000.0000 mL | Freq: Once | INTRAVENOUS | Status: DC

## 2015-06-19 MED ORDER — OMEPRAZOLE 20 MG PO CPDR: 20.0000 mg | DELAYED_RELEASE_CAPSULE | Freq: Every day | ORAL | Status: DC

## 2015-06-19 NOTE — Discharge Instructions (Signed)

## 2015-06-19 NOTE — ED Notes (Signed)
Patient wanted oral meds instead of IV medications.

## 2015-06-19 NOTE — ED Provider Notes (Signed)
CSN: [id number]     Arrival date & time 06/18/15  2131 History   First MD Initiated Contact with Patient 06/18/15 2235     Chief Complaint  Patient presents with  . Chest Pain     (Consider location/radiation/quality/duration/timing/severity/associated sxs/prior Treatment) HPI Raymond Morrow is a 59 year old male with past medical history of hypothyroidism, BPH, anxiety who presents the ER complaining of chest discomfort. Patient states he is eating dinner tonight, experienced a sudden onset of a burning sensation across his central chest which radiated into his arms bilaterally. Patient states the Symptoms Lasted for Several Seconds and Improved Spontaneously. Patient States He Noticed a Dull Ache in His Chest after This, Noticed a Second Episode of This Burning Sensation While at [employer]. He States That Again Lasted First Several Seconds, Subsided Spontaneously.   Past Medical History  Diagnosis Date  . Thyroid disease   . BPH (benign prostatic hyperplasia)   . Anxiety    History reviewed. No pertinent past surgical history. No family history on file. History  Substance Use Topics  . Smoking status: Former Games developermoker  . Smokeless tobacco: Not on file  . Alcohol Use: Yes    Review of Systems  Constitutional: Negative for fever.  HENT: Negative for trouble swallowing.   Eyes: Negative for visual disturbance.  Respiratory: Negative for shortness of breath.   Cardiovascular: Positive for chest pain.  Gastrointestinal: Negative for nausea, vomiting and abdominal pain.  Genitourinary: Negative for dysuria.  Musculoskeletal: Negative for neck pain.  Skin: Negative for rash.  Neurological: Negative for dizziness, weakness and numbness.  Psychiatric/Behavioral: Negative.       Allergies  Review of patient's allergies indicates no known allergies.  Home Medications   Prior to Admission medications   Medication Sig Start Date End Date Taking? Authorizing  Provider  aspirin 81 MG chewable tablet Chew 324 mg by mouth once.   Yes Historical Provider, MD  levothyroxine (SYNTHROID, LEVOTHROID) 100 MCG tablet TAKE ONE TABLET BY MOUTH ONCE DAILY 05/23/15  Yes Roderick PeeJeffrey A Todd, MD  omeprazole (PRILOSEC) 20 MG capsule Take 1 capsule (20 mg total) by mouth daily. 06/19/15   Ladona MowJoe Marily Konczal, PA-C   BP 116/73 mmHg  Pulse 59  Temp(Src) 97.7 F (36.5 C) (Oral)  Resp 12  Ht 5\' 10"  (1.778 m)  Wt 176 lb (79.833 kg)  BMI 25.25 kg/m2  SpO2 98% Physical Exam  Constitutional: He is oriented to person, place, and time. He appears well-developed and well-nourished. No distress.  HENT:  Head: Normocephalic and atraumatic.  Mouth/Throat: Oropharynx is clear and moist. No oropharyngeal exudate.  Eyes: Right eye exhibits no discharge. Left eye exhibits no discharge. No scleral icterus.  Neck: Normal range of motion.  Cardiovascular: Normal rate, regular rhythm and normal heart sounds.   No murmur heard. Pulmonary/Chest: Effort normal and breath sounds normal. No respiratory distress.  Abdominal: Soft. There is no tenderness.  Musculoskeletal: Normal range of motion. He exhibits no edema or tenderness.  Neurological: He is alert and oriented to person, place, and time. He has normal strength. No cranial nerve deficit or sensory deficit. Coordination normal. GCS eye subscore is 4. GCS verbal subscore is 5. GCS motor subscore is 6.  Patient fully alert, answering questions appropriately in full, clear sentences. Cranial nerves II through XII grossly intact. Motor strength 5 out of 5 in all major muscle groups of upper and lower extremities. Distal sensation intact.   Skin: Skin is warm and dry. No rash noted.  He is not diaphoretic.  Psychiatric: He has a normal mood and affect.  Nursing note and vitals reviewed.   ED Course  Procedures (including critical care time) Labs Review Labs Reviewed  BASIC METABOLIC PANEL  CBC  I-STAT TROPOININ, ED  Rosezena Sensor, ED     Imaging Review Dg Chest 2 View  06/18/2015   CLINICAL DATA:  Acute onset of generalized chest pain. Initial encounter.  EXAM: CHEST  2 VIEW  COMPARISON:  None.  FINDINGS: The lungs are well-aerated and clear. There is no evidence of focal opacification, pleural effusion or pneumothorax.  The heart is normal in size; the mediastinal contour is within normal limits. No acute osseous abnormalities are seen.  IMPRESSION: No acute cardiopulmonary process seen.   Electronically Signed   By: Roanna Raider M.D.   On: 06/18/2015 22:50     EKG Interpretation   Date/Time:  Monday June 18 2015 21:36:45 EDT Ventricular Rate:  68 PR Interval:  170 QRS Duration: 86 QT Interval:  368 QTC Calculation: 391 R Axis:   83 Text Interpretation:  Normal sinus rhythm Septal infarct , age  undetermined Abnormal ECG ST elevation inferor leads, no reciprocal  depression No old tracing to compare Confirmed by Rhunette Croft, MD, Janey Genta  [phone]) on 06/18/2015 10:24:04 PM      MDM   Final diagnoses:  Atypical chest pain    Patient with chest discomfort, atypical symptoms. Patient having mild pain only in the ER. Workup large unremarkable. EKG without evidence of acute injury or ectopy. No leukocytosis or anemia. Chest x-ray does not show evidence of acute pathology. Offer patient IV pain medications, however states his symptoms are too mild to necessitate this. patient has no risk factors for PE/DVT. No concern for PE.  Pulses equal bilaterally with normal neurologic exam and transient symptoms.  No concern for vascular etiology of pain.  Troponin and delta 0.  Heart Score 1. Patient afebrile, hemodynamically stable and in no acute distress. Pt's symptoms resolved completely in the ED. Patient stable for d/c to f/u with PCP.  Strict return precautions discussed, pt verbalizes understanding and agreement of this plan.    BP 116/73 mmHg  Pulse 59  Temp(Src) 97.7 F (36.5 C) (Oral)  Resp 12  Ht  (1.778 m)  Wt  176 lb (79.833 kg)  BMI 25.25 kg/m2  SpO2 98%  Signed,  Ladona Mow, PA-C 1:45 AM  Patient discussed with Dr. Mirian Mo, MD   Ladona Mow, PA-C 06/19/15 0145  Mirian Mo, MD 06/19/15 [phone]

## 2015-08-20 ENCOUNTER — Other Ambulatory Visit (INDEPENDENT_AMBULATORY_CARE_PROVIDER_SITE_OTHER): Payer: BLUE CROSS/BLUE SHIELD

## 2015-08-20 DIAGNOSIS — Z0000 Encounter for general adult medical examination without abnormal findings: Secondary | ICD-10-CM | POA: Diagnosis not present

## 2015-08-20 LAB — CBC WITH DIFFERENTIAL/PLATELET
BASOS PCT: 1.2 % (ref 0.0–3.0)
Basophils Absolute: 0 10*3/uL (ref 0.0–0.1)
EOS ABS: 0.3 10*3/uL (ref 0.0–0.7)
Eosinophils Relative: 6.3 % — ABNORMAL HIGH (ref 0.0–5.0)
HCT: 42.7 % (ref 39.0–52.0)
HEMOGLOBIN: 14.5 g/dL (ref 13.0–17.0)
LYMPHS ABS: 1.5 10*3/uL (ref 0.7–4.0)
Lymphocytes Relative: 36.1 % (ref 12.0–46.0)
MCHC: 33.9 g/dL (ref 30.0–36.0)
MCV: 91.2 fl (ref 78.0–100.0)
Monocytes Absolute: 0.3 10*3/uL (ref 0.1–1.0)
Monocytes Relative: 8.2 % (ref 3.0–12.0)
NEUTROS PCT: 48.2 % (ref 43.0–77.0)
Neutro Abs: 2 10*3/uL (ref 1.4–7.7)
PLATELETS: 205 10*3/uL (ref 150.0–400.0)
RBC: 4.68 Mil/uL (ref 4.22–5.81)
RDW: 13.2 % (ref 11.5–15.5)
WBC: 4.2 10*3/uL (ref 4.0–10.5)

## 2015-08-20 LAB — POCT URINALYSIS DIPSTICK
Bilirubin, UA: NEGATIVE
Glucose, UA: NEGATIVE
Ketones, UA: NEGATIVE
Leukocytes, UA: NEGATIVE
NITRITE UA: NEGATIVE
PH UA: 6
Protein, UA: NEGATIVE
RBC UA: NEGATIVE
Spec Grav, UA: 1.01
Urobilinogen, UA: 0.2

## 2015-08-20 LAB — LIPID PANEL
CHOLESTEROL: 190 mg/dL (ref 0–200)
HDL: 51.2 mg/dL (ref >39.00)
LDL Cholesterol: 122 mg/dL — ABNORMAL HIGH (ref 0–99)
NonHDL: 138.99
TRIGLYCERIDES: 83 mg/dL (ref 0.0–149.0)
Total CHOL/HDL Ratio: 4
VLDL: 16.6 mg/dL (ref 0.0–40.0)

## 2015-08-20 LAB — BASIC METABOLIC PANEL
BUN: 18 mg/dL (ref 6–23)
CHLORIDE: 106 meq/L (ref 96–112)
CO2: 27 mEq/L (ref 19–32)
CREATININE: 0.87 mg/dL (ref 0.40–1.50)
Calcium: 9.1 mg/dL (ref 8.4–10.5)
GFR: 95.49 mL/min (ref >60.00)
Glucose, Bld: 93 mg/dL (ref 70–99)
Potassium: 4 mEq/L (ref 3.5–5.1)
Sodium: 140 mEq/L (ref 135–145)

## 2015-08-20 LAB — HEPATIC FUNCTION PANEL
ALT: 23 U/L (ref 0–53)
AST: 22 U/L (ref 0–37)
Albumin: 4.2 g/dL (ref 3.5–5.2)
Alkaline Phosphatase: 70 U/L (ref 39–117)
BILIRUBIN DIRECT: 0.1 mg/dL (ref 0.0–0.3)
BILIRUBIN TOTAL: 0.3 mg/dL (ref 0.2–1.2)
Total Protein: 6.7 g/dL (ref 6.0–8.3)

## 2015-08-20 LAB — TSH: TSH: 3.49 u[IU]/mL (ref 0.35–4.50)

## 2015-08-20 LAB — PSA: PSA: 1.21 ng/mL (ref 0.10–4.00)

## 2015-08-27 ENCOUNTER — Ambulatory Visit (INDEPENDENT_AMBULATORY_CARE_PROVIDER_SITE_OTHER): Payer: BLUE CROSS/BLUE SHIELD | Admitting: Family Medicine

## 2015-08-27 ENCOUNTER — Encounter: Payer: Self-pay | Admitting: Family Medicine

## 2015-08-27 VITALS — BP 115/60 | HR 89 | Temp 98.0°F | Ht 70.0 in | Wt 173.0 lb

## 2015-08-27 DIAGNOSIS — E039 Hypothyroidism, unspecified: Secondary | ICD-10-CM | POA: Diagnosis not present

## 2015-08-27 DIAGNOSIS — N400 Enlarged prostate without lower urinary tract symptoms: Secondary | ICD-10-CM | POA: Diagnosis not present

## 2015-08-27 DIAGNOSIS — Z0000 Encounter for general adult medical examination without abnormal findings: Secondary | ICD-10-CM

## 2015-08-27 MED ORDER — LEVOTHYROXINE SODIUM 100 MCG PO TABS: ORAL_TABLET | ORAL | Status: DC

## 2015-08-27 NOTE — Patient Instructions (Signed)
Continue current medications  Follow-up in one year sooner if any problems  Angela Adam,,,,,,,,,,, our new adult nurse practitioner from [place]

## 2015-08-27 NOTE — Progress Notes (Signed)
Pre visit review using our clinic review tool, if applicable. No additional management support is needed unless otherwise documented below in the visit note. 

## 2015-08-27 NOTE — Progress Notes (Signed)
   Subjective:    Patient ID: Raymond Morrow, male    DOB: 02-Mar-1956, 59 y.o.   MRN: [id number]  HPI Raymond Morrow is a 59 year old married male nonsmoker who comes in today for general physical examination because of a history of underlying hypothyroidism  He takes Synthroid 100 g daily. TSH level normal  He gets routine eye care, dental care, colonoscopy at age 3 normal  Vaccinations history up-to-date tetanus booster 2011. 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   HENT: Negative.   Eyes: Negative.   Respiratory: Negative.   Cardiovascular: Negative.   Gastrointestinal: Negative.   Endocrine: Negative.   Genitourinary: Negative.   Musculoskeletal: Negative.   Skin: Negative.   Allergic/Immunologic: Negative.   Neurological: Negative.   Hematological: Negative.   Psychiatric/Behavioral: Negative.        Objective:   Physical Exam  Constitutional: He is oriented to person, place, and time. He appears well-developed and well-nourished.  HENT:  Head: Normocephalic and atraumatic.  Right Ear: External ear normal.  Left Ear: External ear normal.  Nose: Nose normal.  Mouth/Throat: Oropharynx is clear and moist.  Eyes: Conjunctivae and EOM are normal. Pupils are equal, round, and reactive to light.  Neck: Normal range of motion. Neck supple. No JVD present. No tracheal deviation present. No thyromegaly present.  Cardiovascular: Normal rate, regular rhythm, normal heart sounds and intact distal pulses.  Exam reveals no gallop and no friction rub.   No murmur heard. Pulmonary/Chest: Effort normal and breath sounds normal. No stridor. No respiratory distress. He has no wheezes. He has no rales. He exhibits no tenderness.  Abdominal: Soft. Bowel sounds are normal. He exhibits no distension and no mass. There is no tenderness. There is no rebound and no guarding.  Genitourinary:  Rectum normal, prostate normal and penis normal. Guaiac negative stool. No penile tenderness.  Musculoskeletal: Normal range of motion. He exhibits no edema or tenderness.  Lymphadenopathy:    He has no cervical adenopathy.  Neurological: He is alert and oriented to person, place, and time. He has normal reflexes. No cranial nerve deficit. He exhibits normal muscle tone.  Skin: Skin is warm and dry. No rash noted. No erythema. No pallor.  Psychiatric: He has a normal mood and affect. His behavior is normal. Judgment and thought content normal.  Nursing note and vitals reviewed.         Assessment & Plan:  Healthy male  History of hypothyroidism,,,,,,,, continue Synthroid 100 g daily  Mild BPH.... Continue observation and screening PSAs yearly

## 2016-11-12 ENCOUNTER — Other Ambulatory Visit: Payer: Self-pay | Admitting: Family Medicine

## 2016-11-12 DIAGNOSIS — E039 Hypothyroidism, unspecified: Secondary | ICD-10-CM

## 2017-05-05 IMAGING — DX DG CHEST 2V
2 series · 2 of 2 positions shown · non-contrast
Comparison: None.

CLINICAL DATA: Acute onset of generalized chest pain. Initial
encounter.

EXAM:
CHEST  2 VIEW

[chest pa]
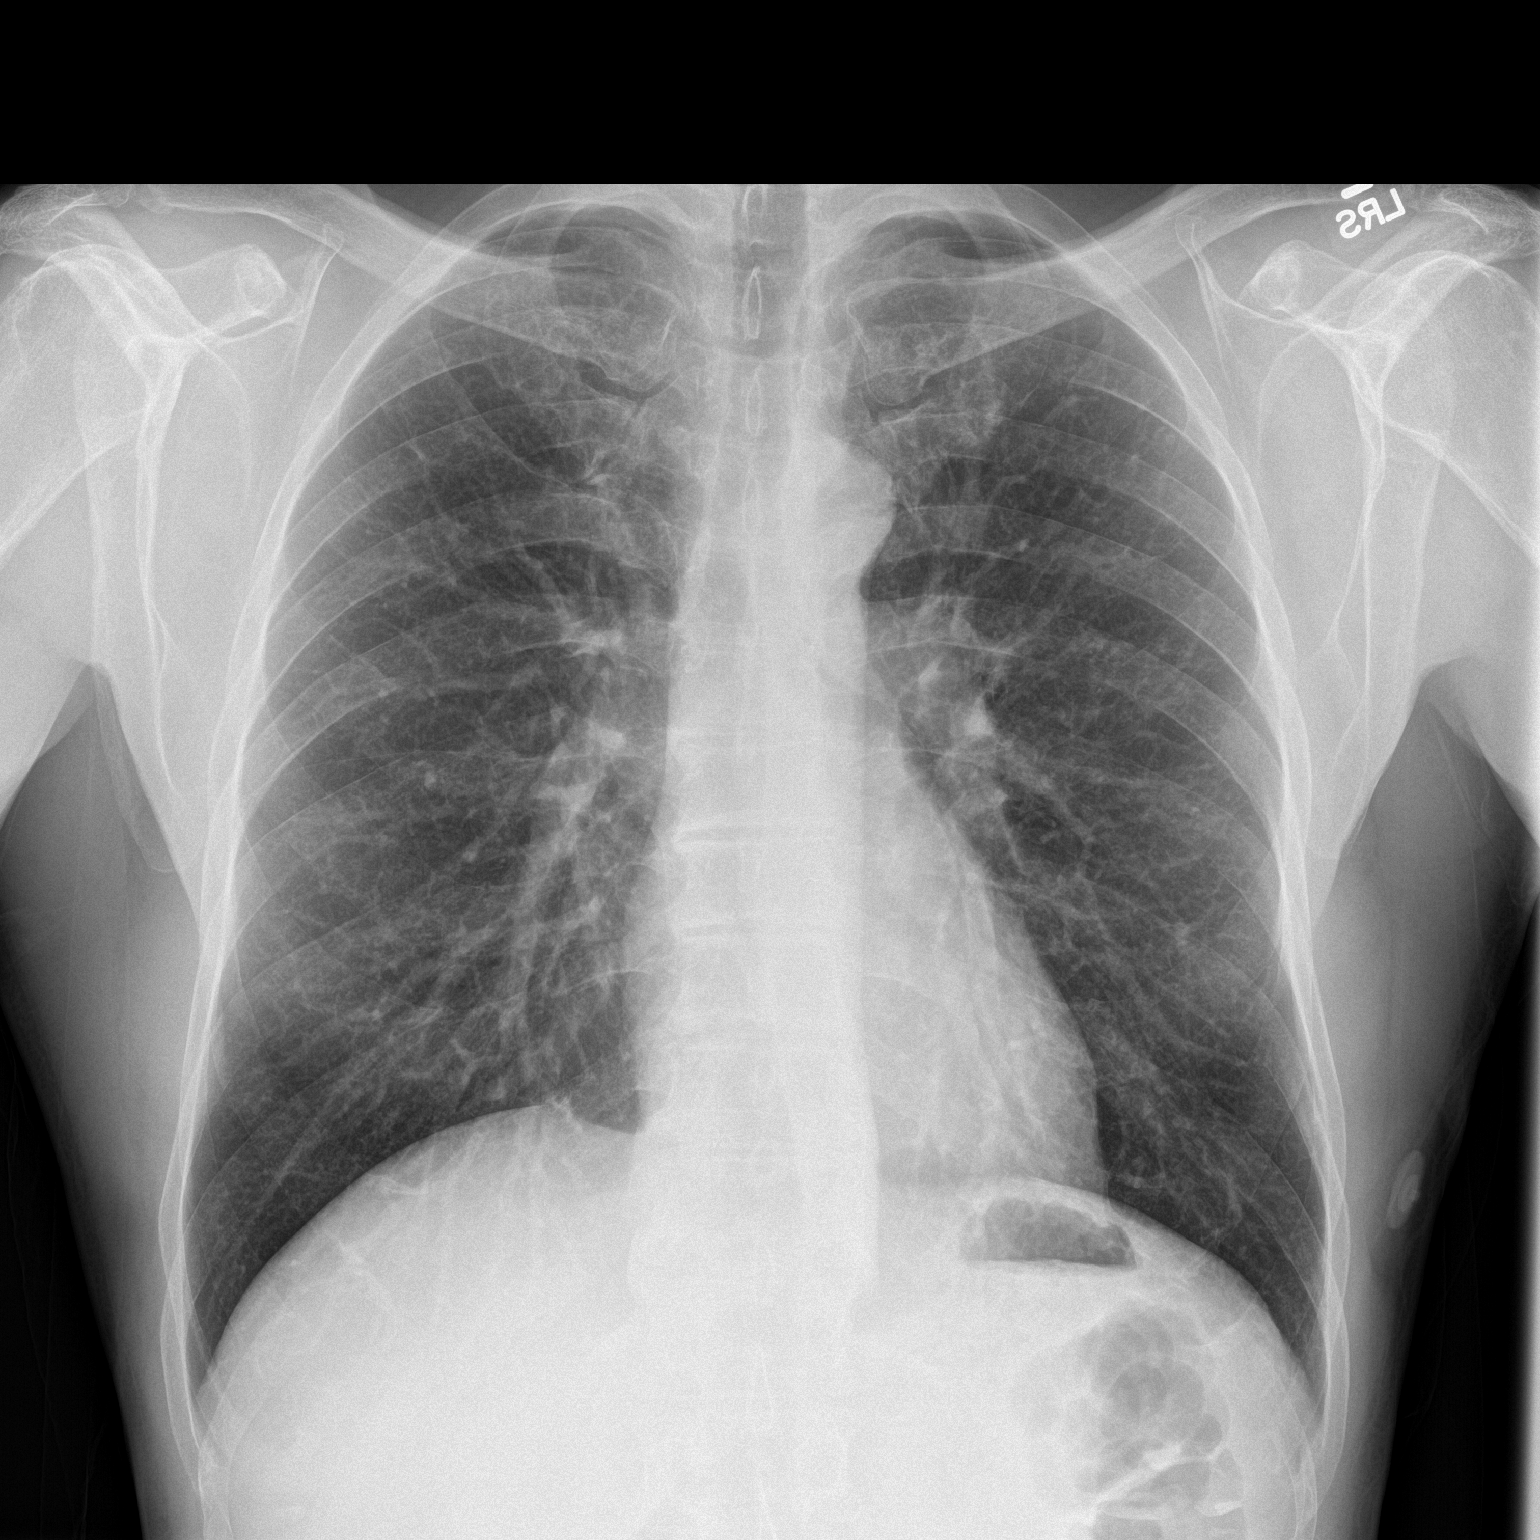

[chest lat]
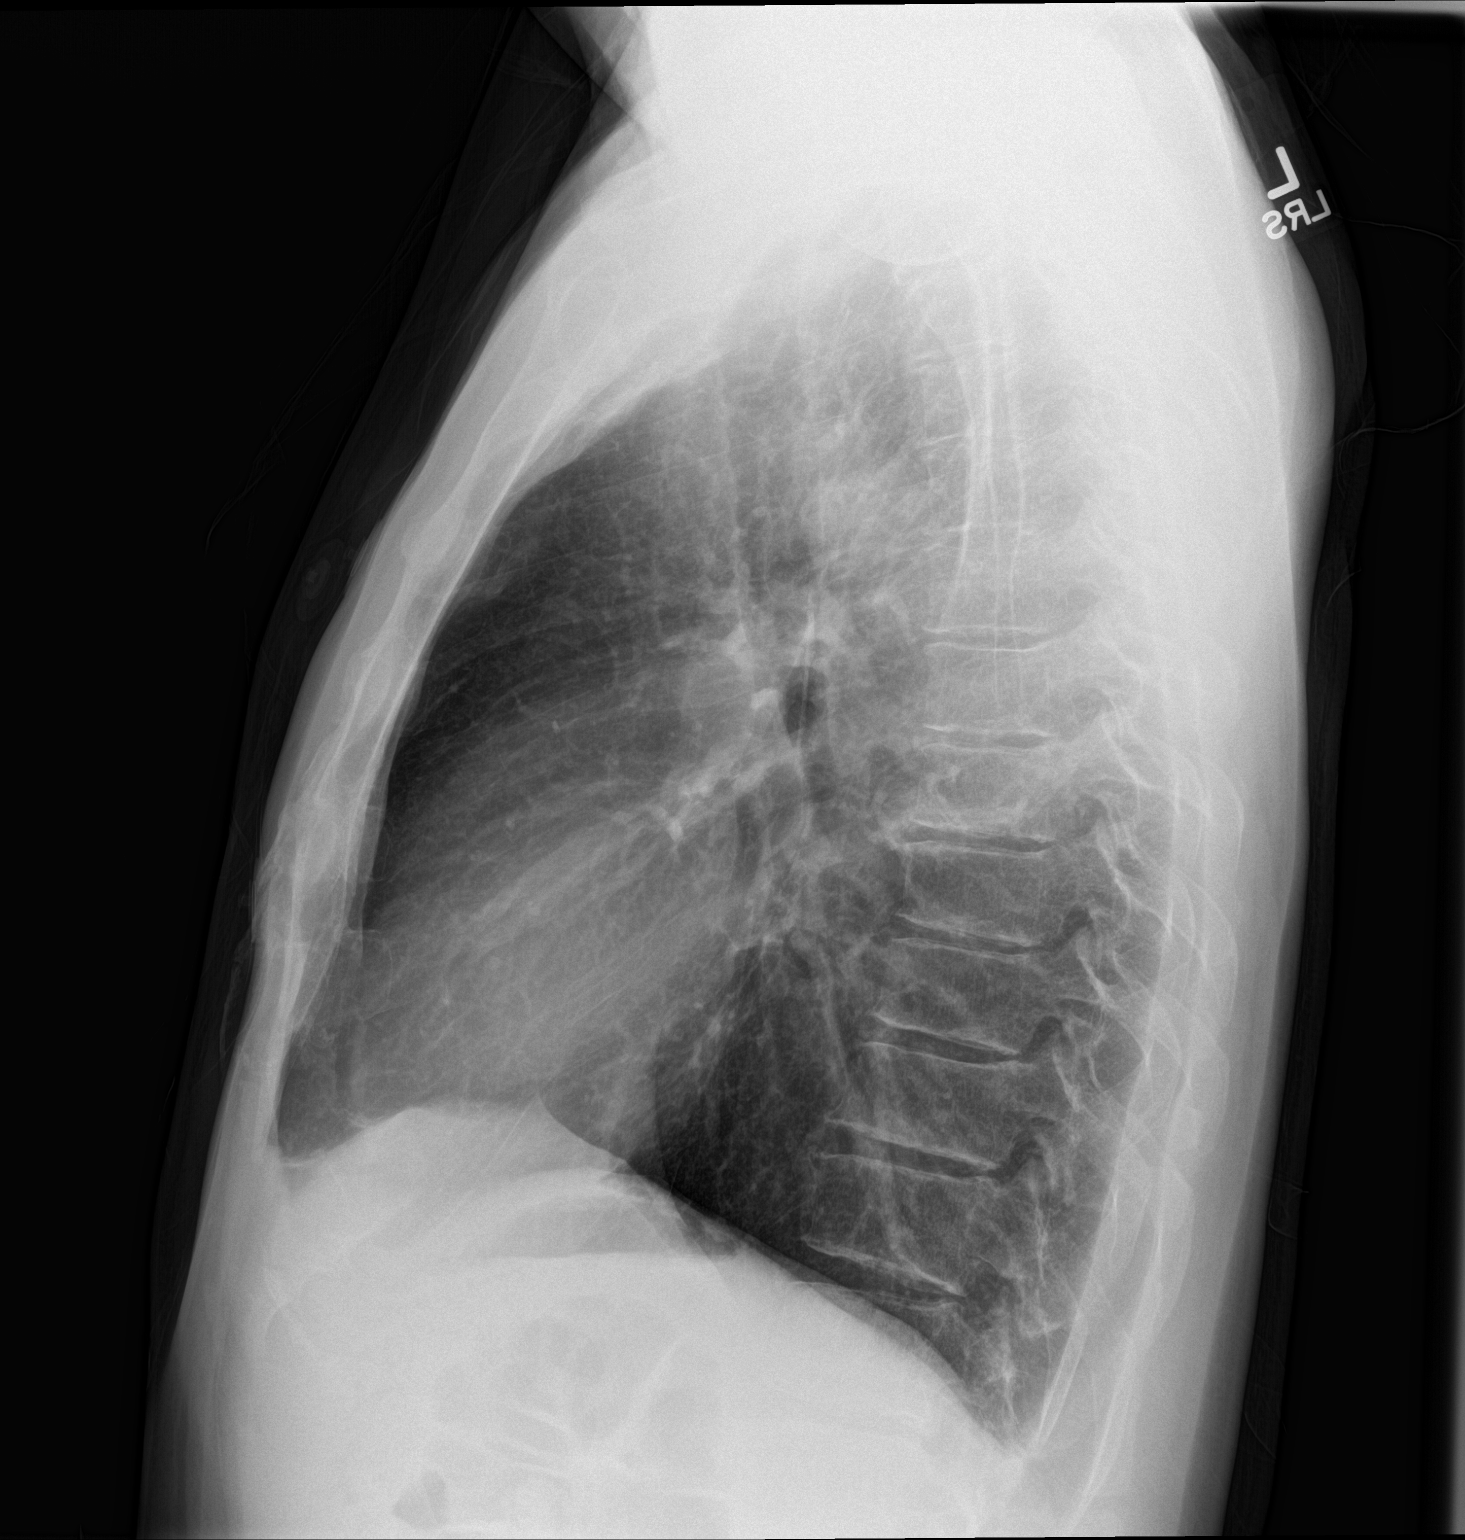

[2 of 2 positions shown; findings below may reference images not displayed]

FINDINGS: The lungs are well-aerated and clear. There is no evidence of focal
opacification, pleural effusion or pneumothorax.

The heart is normal in size; the mediastinal contour is within
normal limits. No acute osseous abnormalities are seen.
IMPRESSION: No acute cardiopulmonary process seen.

## 2017-08-21 ENCOUNTER — Encounter: Payer: Self-pay | Admitting: Family Medicine

## 2018-01-04 ENCOUNTER — Ambulatory Visit: Payer: Self-pay

## 2018-01-04 NOTE — Telephone Encounter (Signed)
Pt. States he is concerned about his cough because he has had pneumonia in the past.  Reason for Disposition . [1] Continuous (nonstop) coughing interferes with work or school AND [2] no improvement using cough treatment per Care Advice  Answer Assessment - Initial Assessment Questions 1. ONSET: "When did the cough begin?"      Started 1 week ago 2. SEVERITY: "How bad is the cough today?"      Moderate 3. RESPIRATORY DISTRESS: "Describe your breathing."      Shortness of breath with laying down 4. FEVER: "Do you have a fever?" If so, ask: "What is your temperature, how was it measured, and when did it start?"     No 5. SPUTUM: "Describe the color of your sputum" (clear, white, yellow, green)     Green 6. HEMOPTYSIS: "Are you coughing up any blood?" If so ask: "How much?" (flecks, streaks, tablespoons, etc.)     No 7. CARDIAC HISTORY: "Do you have any history of heart disease?" (e.g., heart attack, congestive heart failure)      No 8. LUNG HISTORY: "Do you have any history of lung disease?"  (e.g., pulmonary embolus, asthma, emphysema)     No - except for pneumonia 9. PE RISK FACTORS: "Do you have a history of blood clots?" (or: recent major surgery, recent prolonged travel, bedridden )     No 10. OTHER SYMPTOMS: "Do you have any other symptoms?" (e.g., runny nose, wheezing, chest pain)       Runny nose 11. PREGNANCY: "Is there any chance you are pregnant?" "When was your last menstrual period?"       No 12. TRAVEL: "Have you traveled out of the country in the last month?" (e.g., travel history, exposures)       No  Protocols used: COUGH - ACUTE PRODUCTIVE-A-AH

## 2018-01-05 ENCOUNTER — Encounter: Payer: Self-pay | Admitting: Adult Health

## 2018-01-05 ENCOUNTER — Ambulatory Visit (INDEPENDENT_AMBULATORY_CARE_PROVIDER_SITE_OTHER): Payer: Self-pay | Admitting: Adult Health

## 2018-01-05 VITALS — BP 96/60 | HR 69 | Temp 97.8°F | Wt 174.0 lb

## 2018-01-05 DIAGNOSIS — J069 Acute upper respiratory infection, unspecified: Secondary | ICD-10-CM

## 2018-01-05 DIAGNOSIS — M25551 Pain in right hip: Secondary | ICD-10-CM

## 2018-01-05 NOTE — Progress Notes (Signed)
   Subjective:    Patient ID: Raymond Morrow, Raymond Morrow    DOB: Feb 11, 1956, 62 y.o.   MRN: [id number]  HPI Presents with 1 week history of fatigue, hoarseness, some shortness of breath on occasion.  No fever.  Does have nasal congestion with minimal cough.  Cough is dry.  Using elderberry syrup.  He does feel like he has improved but has a history of pneumonia and would like some reassurance that he is getting well.  Also having dull right hip pain following heavy lifting 2 weeks ago.  Pain comes and goes and is brief.  Pain radiates from right hip to back or down the front of his groin.  He does has a history a repaired right inguinal hernia but is concerned that this pain may be related to that hernia.     Review of Systems  Constitutional: Positive for activity change, chills and fatigue. Negative for fever.  HENT: Positive for congestion and sore throat. Negative for ear discharge, ear pain, sinus pressure and sinus pain.   Respiratory: Positive for cough and shortness of breath.   Gastrointestinal: Negative for abdominal distention, abdominal pain and constipation.          Objective:   Physical Exam  Constitutional: He appears well-developed and well-nourished.  HENT:  Head: Normocephalic.  Right Ear: External ear normal. A middle ear effusion is present.  Left Ear: Tympanic membrane and external ear normal.  Nose: Mucosal edema present. Right sinus exhibits no maxillary sinus tenderness and no frontal sinus tenderness. Left sinus exhibits no maxillary sinus tenderness and no frontal sinus tenderness.  Mouth/Throat: Uvula is midline. Oropharyngeal exudate and posterior oropharyngeal erythema present.  Cardiovascular: Normal rate, regular rhythm and normal heart sounds.  Pulmonary/Chest: Effort normal and breath sounds normal.  Musculoskeletal:       Right hip: Normal.  Non-producible pain in the right hip with flexion or movement of the right hip.    Lymphadenopathy:    He has  no cervical adenopathy.          Assessment & Plan:   1. Upper respiratory tract infection, unspecified type Encourage fluid intake, hot tea with honey.  Would benefit from OTC Flonase or Claritin.    2. Right hip pain Apply heat 20 minutes at a time three times a day as needed.  Stretching exercises.  Activity as tolerated.

## 2018-01-05 NOTE — Progress Notes (Signed)
Subjective:    Patient ID: Raymond Morrow, male    DOB: 11-Nov-1956, 62 y.o.   MRN: [id number]  URI   This is a new problem. The current episode started in the past 7 days. The problem has been gradually improving. There has been no fever. Associated symptoms include congestion, coughing and a sore throat. Pertinent negatives include no ear pain, headaches, plugged ear sensation, sinus pain, swollen glands or wheezing.     Also reports right hip pain that started about two weeks ago  after moving a heavy desk. Pain is intermittent, described as " dull" and "deep". No additional pain with weight bearing/twisting and turning. No gait abnormalities    Review of Systems  Constitutional: Positive for activity change, chills and fatigue. Negative for diaphoresis and fever.  HENT: Positive for congestion and sore throat. Negative for ear pain and sinus pain.   Respiratory: Positive for cough and shortness of breath. Negative for wheezing.   Cardiovascular: Negative.   Neurological: Negative for headaches.   Past Medical History:  Diagnosis Date  . Anxiety   . BPH (benign prostatic hyperplasia)   . Thyroid disease     Social History   Socioeconomic History  . Marital status: Married    Spouse name: Not on file  . Number of children: Not on file  . Years of education: Not on file  . Highest education level: Not on file  Social Needs  . Financial resource strain: Not on file  . Food insecurity - worry: Not on file  . Food insecurity - inability: Not on file  . Transportation needs - medical: Not on file  . Transportation needs - non-medical: Not on file  Occupational History  . Not on file  Tobacco Use  . Smoking status: Former Smoker  Substance and Sexual Activity  . Alcohol use: Yes  . Drug use: No  . Sexual activity: Not on file  Other Topics Concern  . Not on file  Social History Narrative  . Not on file    History reviewed. No pertinent surgical  history.  History reviewed. No pertinent family history.  No Known Allergies  Current Outpatient Medications on File Prior to Visit  Medication Sig Dispense Refill  . levothyroxine (SYNTHROID, LEVOTHROID) 100 MCG tablet TAKE ONE TABLET BY MOUTH ONCE DAILY 90 tablet 4   No current facility-administered medications on file prior to visit.     BP 96/60   Pulse 69   Temp 97.8 F (36.6 C) (Oral)   Wt 174 lb (78.9 kg)   SpO2 98%   BMI 24.97 kg/m       Objective:   Physical Exam  Constitutional: He is oriented to person, place, and time. He appears well-developed and well-nourished. No distress.  HENT:  Right Ear: Hearing, external ear and ear canal normal. Tympanic membrane is bulging. Tympanic membrane is not erythematous.  Left Ear: Hearing, tympanic membrane, external ear and ear canal normal. Tympanic membrane is not erythematous.  Nose: Mucosal edema and rhinorrhea present. Right sinus exhibits no maxillary sinus tenderness and no frontal sinus tenderness. Left sinus exhibits no maxillary sinus tenderness and no frontal sinus tenderness.  Mouth/Throat: Uvula is midline. Posterior oropharyngeal erythema (mild ) present.  + PND   Eyes: Conjunctivae and EOM are normal. Pupils are equal, round, and reactive to light. Right eye exhibits no discharge. Left eye exhibits no discharge. No scleral icterus.  Cardiovascular: Normal rate, regular rhythm, normal heart sounds and intact distal pulses.  Exam reveals no gallop and no friction rub.  No murmur heard. Pulmonary/Chest: Effort normal and breath sounds normal. No respiratory distress. He has no wheezes. He has no rales. He exhibits no tenderness.  Abdominal: Soft. Bowel sounds are normal. He exhibits no distension and no mass. There is no tenderness. There is no rebound, no guarding and no CVA tenderness. No hernia.  Musculoskeletal: Normal range of motion. He exhibits no edema, tenderness or deformity.  No pain with straight leg  raise, knee to chest and external internal rotation   Neurological: He is alert and oriented to person, place, and time. He has normal reflexes.  Skin: Skin is warm and dry. No rash noted. He is not diaphoretic. No erythema. No pallor.  Psychiatric: He has a normal mood and affect. His behavior is normal. Judgment and thought content normal.  Nursing note and vitals reviewed.     Assessment & Plan:  1. Upper respiratory tract infection, unspecified type - No concern for bacterial infection  - Can use Claritin or flonase  - Warm tea and honey  - Follow up if fevers develop or symptoms worsen   2. Right hip pain - Appears MSK in nature not concerned for bursitis  - no hernia noted  - Advised stretching exercises and warm compress  Shirline Frees, NP  * I have reviewed and agree with NP student Amy Bilicki

## 2018-02-11 ENCOUNTER — Other Ambulatory Visit: Payer: Self-pay | Admitting: Family Medicine

## 2018-02-11 DIAGNOSIS — E039 Hypothyroidism, unspecified: Secondary | ICD-10-CM

## 2018-04-27 ENCOUNTER — Encounter: Payer: Self-pay | Admitting: Family Medicine

## 2018-04-27 ENCOUNTER — Ambulatory Visit (INDEPENDENT_AMBULATORY_CARE_PROVIDER_SITE_OTHER): Payer: Self-pay | Admitting: Family Medicine

## 2018-04-27 VITALS — BP 108/76 | HR 64 | Temp 98.1°F | Wt 173.0 lb

## 2018-04-27 DIAGNOSIS — F988 Other specified behavioral and emotional disorders with onset usually occurring in childhood and adolescence: Secondary | ICD-10-CM

## 2018-04-27 MED ORDER — AMPHETAMINE-DEXTROAMPHET ER 10 MG PO CP24: 10.0000 mg | ORAL_CAPSULE | Freq: Every day | ORAL | 0 refills | Status: DC

## 2018-04-27 NOTE — Patient Instructions (Signed)
Adderall 10 mg long-acting..........Marland Kitchen 1 daily in the morning   Send [place] a message on my chart in 3 weeks and let us know how you're feeling. Then we will call you and discuss continue this dose or maybe increasing the dose or trying another medication whatever takes to help you

## 2018-04-27 NOTE — Progress Notes (Signed)
Raymond Morrow is a 62 year old married male nonsmoker who comes in today for evaluation of the inability to focus and concentrate  He states this is been a lifelong problem but over the past 6 months is gotten worse. 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  He was a [employer] student in high school and had difficulty focusing concentrating then. He went to [employer] with an Actuary focus but didn't last long at school.  Family history pertinent his father was an alcoholic. Younger brother died of HIV older brother has an issue with anxiety and depression.  He's never self medicated.  BP 108/76 (BP Location: Left Arm, Patient Position: Sitting, Cuff Size: Large)   Pulse 64   Temp 98.1 F (36.7 C) (Oral)   Wt 173 lb (78.5 kg)   SpO2 97%   BMI 24.82 kg/m  Well-developed well-nourished male no acute distress vital signs stable he is afebrile  #1 adult ADD.......... trial of Adderall 10 mg long-acting daily in the morning follow-up in 3 weeks

## 2019-03-18 ENCOUNTER — Telehealth: Payer: Self-pay | Admitting: *Deleted

## 2019-03-18 DIAGNOSIS — E039 Hypothyroidism, unspecified: Secondary | ICD-10-CM

## 2019-03-18 MED ORDER — LEVOTHYROXINE SODIUM 100 MCG PO TABS: 100.0000 ug | ORAL_TABLET | Freq: Every day | ORAL | 1 refills | Status: DC

## 2019-03-21 NOTE — Telephone Encounter (Signed)
Pt stated he went to pick up his refill and they wouldn't give it to him pt need refill.  Levothyroxine Sodium 100 mcg  [facility]

## 2019-03-22 ENCOUNTER — Other Ambulatory Visit: Payer: Self-pay

## 2019-03-22 ENCOUNTER — Ambulatory Visit (INDEPENDENT_AMBULATORY_CARE_PROVIDER_SITE_OTHER): Payer: Self-pay | Admitting: Family Medicine

## 2019-03-22 DIAGNOSIS — E039 Hypothyroidism, unspecified: Secondary | ICD-10-CM

## 2019-03-22 NOTE — Progress Notes (Signed)
Patient ID: Raymond Morrow, male   DOB: Jan 13, 1956, 62 y.o.   MRN: [id number]  Virtual Visit via Video Note  I connected with Raymond Morrow on 03/22/19 at 10:30 AM EDT by a video enabled telemedicine application and verified that I am speaking with the correct person using two identifiers.  Location patient: home Location provider:work or home office Persons participating in the virtual visit: patient, provider Attempted video visit but had technical difficulties  I discussed the limitations of evaluation and management by telemedicine and the availability of in person appointments. The patient expressed understanding and agreed to proceed.   HPI: Hypothyroidism.  He takes levothyroxine 100 mcg daily.  This was sent in for refill last week.  He has not had lab work since 2016.  He generally feels well.  No overt symptoms of hypothyroidism.  He has not had physical since 2016.  He would like to consider scheduling this year.  He states his been compliant with thyroid medication.  Appetite and weight are stable.   ROS: See pertinent positives and negatives per HPI.  Past Medical History:  Diagnosis Date  . Anxiety   . BPH (benign prostatic hyperplasia)   . Thyroid disease     No past surgical history on file.  No family history on file.  SOCIAL HX: Smoker.  Married.  Owns carpet cleaning business   Current Outpatient Medications:  .  amphetamine-dextroamphetamine (ADDERALL XR) 10 MG 24 hr capsule, Take 1 capsule (10 mg total) by mouth daily., Disp: 30 capsule, Rfl: 0 .  levothyroxine (SYNTHROID) 100 MCG tablet, Take 1 tablet (100 mcg total) by mouth daily., Disp: 90 tablet, Rfl: 1  EXAM:  VITALS per patient if applicable:  GENERAL: alert, oriented, appears well and in no acute distress  HEENT: atraumatic, conjunttiva clear, no obvious abnormalities on inspection of external nose and ears  NECK: normal movements of the head and neck  LUNGS: on inspection no signs of  respiratory distress, breathing rate appears normal, no obvious gross SOB, gasping or wheezing  CV: no obvious cyanosis  MS: moves all visible extremities without noticeable abnormality  PSYCH/NEURO: pleasant and cooperative, no obvious depression or anxiety, speech and thought processing grossly intact  ASSESSMENT AND PLAN:  Discussed the following assessment and plan:  Hypothyroidism-symptomatically stable on replacement.  Overdue for labs  -Future order for TSH placed and he will come in a couple weeks to get that done -Consider setting up complete physical at some point this year     I discussed the assessment and treatment plan with the patient. The patient was provided an opportunity to ask questions and all were answered. The patient agreed with the plan and demonstrated an understanding of the instructions.   The patient was advised to call back or seek an in-person evaluation if the symptoms worsen or if the condition fails to improve as anticipated.   Evelena Peat, MD

## 2019-03-22 NOTE — Telephone Encounter (Signed)
Called patient and he stated that he did get his Levothyroxine finally. Also patient is due for labs, has not done since 2016. I have set him up with a Doxy appointment this morning at 10:30am.

## 2019-06-07 ENCOUNTER — Telehealth: Payer: Self-pay | Admitting: Family Medicine

## 2019-06-07 DIAGNOSIS — E039 Hypothyroidism, unspecified: Secondary | ICD-10-CM

## 2019-06-07 NOTE — Telephone Encounter (Signed)
Pt needs TSH.  Ordered entered and he wants to come this Friday at 8:30 if possible.

## 2019-06-08 NOTE — Telephone Encounter (Signed)
I have scheduled patient for this appointment.

## 2019-06-10 ENCOUNTER — Other Ambulatory Visit: Payer: Self-pay

## 2019-06-10 ENCOUNTER — Encounter: Payer: Self-pay | Admitting: Family Medicine

## 2019-06-10 ENCOUNTER — Other Ambulatory Visit (INDEPENDENT_AMBULATORY_CARE_PROVIDER_SITE_OTHER): Payer: Self-pay

## 2019-06-10 DIAGNOSIS — E039 Hypothyroidism, unspecified: Secondary | ICD-10-CM

## 2019-06-10 LAB — TSH: TSH: 5.06 u[IU]/mL — ABNORMAL HIGH (ref 0.35–4.50)

## 2019-06-14 ENCOUNTER — Telehealth: Payer: Self-pay | Admitting: Family Medicine

## 2019-06-14 MED ORDER — LEVOTHYROXINE SODIUM 112 MCG PO TABS: 112.0000 ug | ORAL_TABLET | Freq: Every day | ORAL | 3 refills | Status: DC

## 2019-06-14 NOTE — Telephone Encounter (Signed)
I responded on patients MyChart message today and let him know this

## 2019-06-14 NOTE — Telephone Encounter (Signed)
I went ahead and refilled his Levothyroxine 112 mcg .   There should be another result note reflecting recommendation to increase dose- so disregard that.

## 2019-12-05 ENCOUNTER — Telehealth: Payer: Self-pay | Admitting: Family Medicine

## 2019-12-05 NOTE — Telephone Encounter (Signed)
Patient stated that his pharmacy, [facility] [address] stated that the manufacturer of his levothyroxine (SYNTHROID) 112 MCG tablet   medication is no longer in business, so they need permission from his provider to change to another manufacturer before they can fill his refill.

## 2019-12-07 NOTE — Telephone Encounter (Signed)
[facility] pharmacy and spoke to Joe and gave him the verbal OK per Dr. Caryl Never to change the manufacturer. Joe verbalized an understanding.

## 2020-03-15 ENCOUNTER — Other Ambulatory Visit: Payer: Self-pay | Admitting: Family Medicine

## 2020-05-14 ENCOUNTER — Other Ambulatory Visit: Payer: Self-pay

## 2020-05-14 ENCOUNTER — Encounter: Payer: Self-pay | Admitting: Family Medicine

## 2020-05-14 ENCOUNTER — Ambulatory Visit: Payer: Self-pay | Admitting: Family Medicine

## 2020-05-14 VITALS — BP 126/72 | HR 72 | Temp 98.0°F | Wt 180.9 lb

## 2020-05-14 DIAGNOSIS — E039 Hypothyroidism, unspecified: Secondary | ICD-10-CM

## 2020-05-14 MED ORDER — LEVOTHYROXINE SODIUM 112 MCG PO TABS: 112.0000 ug | ORAL_TABLET | Freq: Every day | ORAL | 3 refills | Status: DC

## 2020-05-14 NOTE — Progress Notes (Signed)
Established Patient Office Visit  Subjective:  Patient ID: Raymond Morrow, male    DOB: 03-15-1956  Age: 64 y.o. MRN: [id number]  CC:  Chief Complaint  Patient presents with  . Medication Refill    on levothyroxine    HPI Raymond Morrow presents for medication refill for levothyroxine.  He has been on this for several years.  Last labs were done back in July of last year.  He takes this consistently.  He has no specific complaints today.  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  He has not had a physical in years.  His last colonoscopy was about 10 years ago  Past Medical History:  Diagnosis Date  . Anxiety   . BPH (benign prostatic hyperplasia)   . Thyroid disease     No past surgical history on file.  No family history on file.  Social History   Socioeconomic History  . Marital status: Married    Spouse name: Not on file  . Number of children: Not on file  . Years of education: Not on file  . Highest education level: Not on file  Occupational History  . Not on file  Tobacco Use  . Smoking status: Former Research scientist (life sciences)  . Smokeless tobacco: Former Network engineer and Sexual Activity  . Alcohol use: Yes  . Drug use: No  . Sexual activity: Not on file  Other Topics Concern  . Not on file  Social History Narrative  . Not on file   Social Determinants of Health   Financial Resource Strain:   . Difficulty of Paying Living Expenses:   Food Insecurity:   . Worried About Charity fundraiser in the Last Year:   . Arboriculturist in the Last Year:   Transportation Needs:   . Film/video editor (Medical):   Marland Kitchen Lack of Transportation (Non-Medical):   Physical Activity:   . Days of Exercise per Week:   . Minutes of Exercise per Session:   Stress:   . Feeling of Stress :   Social Connections:   . Frequency of Communication with Friends and Family:   . Frequency of Social Gatherings with Friends and Family:   . Attends Religious  Services:   . Active Member of Clubs or Organizations:   . Attends Archivist Meetings:   Marland Kitchen Marital Status:   Intimate Partner Violence:   . Fear of Current or Ex-Partner:   . Emotionally Abused:   Marland Kitchen Physically Abused:   . Sexually Abused:     Outpatient Medications Prior to Visit  Medication Sig Dispense Refill  . amphetamine-dextroamphetamine (ADDERALL XR) 10 MG 24 hr capsule Take 1 capsule (10 mg total) by mouth daily. (Patient not taking: Reported on 05/14/2020) 30 capsule 0  . levothyroxine (SYNTHROID) 112 MCG tablet Take 1 tablet (112 mcg total) by mouth daily. NEEDS APPT FOR FURTHER REFILLS 30 tablet 0   No facility-administered medications prior to visit.    No Known Allergies  ROS Review of Systems  Constitutional: Negative for fatigue.  Eyes: Negative for visual disturbance.  Respiratory: Negative for cough, chest tightness and shortness of breath.   Cardiovascular: Negative for chest pain, palpitations and leg swelling.  Neurological: Negative for dizziness, syncope, weakness, light-headedness and headaches.      Objective:    Physical Exam Vitals reviewed.  Constitutional:      Appearance: Normal appearance.  Cardiovascular:     Rate and Rhythm:  Normal rate and regular rhythm.  Pulmonary:     Effort: Pulmonary effort is normal.     Breath sounds: Normal breath sounds.  Neurological:     Mental Status: He is alert.     BP 126/72 (BP Location: Left Arm, Patient Position: Sitting, Cuff Size: Normal)   Pulse 72   Temp 98 F (36.7 C) (Temporal)   Wt 180 lb 14.4 oz (82.1 kg)   SpO2 96%   BMI 25.96 kg/m  Wt Readings from Last 3 Encounters:  05/14/20 180 lb 14.4 oz (82.1 kg)  04/27/18 173 lb (78.5 kg)  01/05/18 174 lb (78.9 kg)     Health Maintenance Due  Topic Date Due  . Hepatitis C Screening  Never done  . COVID-19 Vaccine (1) Never done  . HIV Screening  Never done  . COLONOSCOPY  Never done    There are no preventive care  reminders to display for this patient.  Lab Results  Component Value Date   TSH 5.06 (H) 06/10/2019   Lab Results  Component Value Date   WBC 4.2 08/20/2015   HGB 14.5 08/20/2015   HCT 42.7 08/20/2015   MCV 91.2 08/20/2015   PLT 205.0 08/20/2015   Lab Results  Component Value Date   NA 140 08/20/2015   K 4.0 08/20/2015   CO2 27 08/20/2015   GLUCOSE 93 08/20/2015   BUN 18 08/20/2015   CREATININE 0.87 08/20/2015   BILITOT 0.3 08/20/2015   ALKPHOS 70 08/20/2015   AST 22 08/20/2015   ALT 23 08/20/2015   PROT 6.7 08/20/2015   ALBUMIN 4.2 08/20/2015   CALCIUM 9.1 08/20/2015   ANIONGAP 7 06/18/2015   GFR 95.49 08/20/2015   Lab Results  Component Value Date   CHOL 190 08/20/2015   Lab Results  Component Value Date   HDL 51.20 08/20/2015   Lab Results  Component Value Date   LDLCALC 122 (H) 08/20/2015   Lab Results  Component Value Date   TRIG 83.0 08/20/2015   Lab Results  Component Value Date   CHOLHDL 4 08/20/2015   No results found for: HGBA1C    Assessment & Plan:   Hypothyroidism.  Treated with levothyroxine.  Due for follow-up labs soon  -Future lab order placed for TSH -Refilled levothyroxine for 1 year -Suggest he consider setting up complete physical and repeat colonoscopy this year  Meds ordered this encounter  Medications  . levothyroxine (SYNTHROID) 112 MCG tablet    Sig: Take 1 tablet (112 mcg total) by mouth daily.    Dispense:  90 tablet    Refill:  3    Follow-up: No follow-ups on file.    Evelena Peat, MD

## 2020-05-14 NOTE — Patient Instructions (Signed)
Set up labs for this Thursday  Consider physical at some point later this year.

## 2020-05-17 ENCOUNTER — Other Ambulatory Visit (INDEPENDENT_AMBULATORY_CARE_PROVIDER_SITE_OTHER): Payer: Self-pay

## 2020-05-17 ENCOUNTER — Other Ambulatory Visit: Payer: Self-pay

## 2020-05-17 DIAGNOSIS — E039 Hypothyroidism, unspecified: Secondary | ICD-10-CM

## 2020-05-17 LAB — TSH: TSH: 3.88 u[IU]/mL (ref 0.35–4.50)

## 2020-07-19 ENCOUNTER — Other Ambulatory Visit: Payer: Self-pay | Admitting: Family Medicine

## 2020-08-21 ENCOUNTER — Other Ambulatory Visit (HOSPITAL_COMMUNITY): Payer: Self-pay

## 2020-10-14 ENCOUNTER — Other Ambulatory Visit: Payer: Self-pay | Admitting: Family Medicine

## 2021-05-04 ENCOUNTER — Other Ambulatory Visit: Payer: Self-pay | Admitting: Family Medicine

## 2021-05-31 ENCOUNTER — Telehealth: Payer: Self-pay | Admitting: Family Medicine

## 2021-05-31 NOTE — Telephone Encounter (Signed)
Spoke with the patient. He is aware that a 90 day supply was sent in on 05/06/2021 and will check with his pharmacy. The patient was also made aware that he will need a physical for any further refills.

## 2021-05-31 NOTE — Telephone Encounter (Signed)
Patient called stating he would like a refill of levothyroxine (SYNTHROID) 112 [facility]CG [facility] [address] [place] - [address]  Patient is not sure whether he needs a visit to have this refilled or not. He would like a call if he needs a visit.  If he does not need a visit, he would like a call once the medication refill has been sent in.  Best contact number for patient is [phone].  Please advise.

## 2021-07-05 ENCOUNTER — Telehealth (INDEPENDENT_AMBULATORY_CARE_PROVIDER_SITE_OTHER): Payer: Self-pay | Admitting: Family Medicine

## 2021-07-05 ENCOUNTER — Other Ambulatory Visit: Payer: Self-pay

## 2021-07-05 DIAGNOSIS — E039 Hypothyroidism, unspecified: Secondary | ICD-10-CM

## 2021-07-05 MED ORDER — LEVOTHYROXINE SODIUM 112 MCG PO TABS: 112.0000 ug | ORAL_TABLET | Freq: Every day | ORAL | 3 refills | Status: DC

## 2021-07-05 NOTE — Progress Notes (Signed)
Patient ID: Raymond Morrow, male   DOB: 08/25/1956, 65 y.o.   MRN: [id number]   This visit type was conducted due to national recommendations for restrictions regarding the COVID-19 pandemic in an effort to limit this patient's exposure and mitigate transmission in our community.   Virtual Visit via Video Note  I connected with Raymond Morrow on 07/05/21 at  7:15 AM EDT by a video enabled telemedicine application and verified that I am speaking with the correct person using two identifiers.  Location patient: home Location provider:work or home office Persons participating in the virtual visit: patient, provider  I discussed the limitations of evaluation and management by telemedicine and the availability of in person appointments. The patient expressed understanding and agreed to proceed.   HPI:  Raymond Morrow has hypothyroidism.  He takes levothyroxine 112 mcg daily.  Compliant with therapy for the most part.  Only rarely misses doses.  Last TSH year ago was normal.  Needs follow-up labs soon.  No fatigue issues.  Generally feels well.  No cold intolerance.  No reported constipation.   ROS: See pertinent positives and negatives per HPI.  Past Medical History:  Diagnosis Date   Anxiety    BPH (benign prostatic hyperplasia)    Thyroid disease     No past surgical history on file.  No family history on file.  SOCIAL HX: Non-smoker   Current Outpatient Medications:    levothyroxine (SYNTHROID) 112 MCG tablet, Take 1 tablet (112 mcg total) by mouth daily., Disp: 90 tablet, Rfl: 3  EXAM:  VITALS per patient if applicable:  GENERAL: alert, oriented, appears well and in no acute distress  HEENT: atraumatic, conjunttiva clear, no obvious abnormalities on inspection of external nose and ears  NECK: normal movements of the head and neck  LUNGS: on inspection no signs of respiratory distress, breathing rate appears normal, no obvious gross SOB, gasping or wheezing  CV: no obvious  cyanosis  MS: moves all visible extremities without noticeable abnormality  PSYCH/NEURO: pleasant and cooperative, no obvious depression or anxiety, speech and thought processing grossly intact  ASSESSMENT AND PLAN:  Discussed the following assessment and plan:  Hypothyroidism, unspecified type - Plan: TSH -Future lab order placed -Refill levothyroxine for 1 year     I discussed the assessment and treatment plan with the patient. The patient was provided an opportunity to ask questions and all were answered. The patient agreed with the plan and demonstrated an understanding of the instructions.   The patient was advised to call back or seek an in-person evaluation if the symptoms worsen or if the condition fails to improve as anticipated.     Raymond Peat, MD

## 2021-07-08 ENCOUNTER — Other Ambulatory Visit (INDEPENDENT_AMBULATORY_CARE_PROVIDER_SITE_OTHER): Payer: Self-pay

## 2021-07-08 ENCOUNTER — Telehealth: Payer: Self-pay | Admitting: Family Medicine

## 2021-07-08 ENCOUNTER — Other Ambulatory Visit: Payer: Self-pay

## 2021-07-08 DIAGNOSIS — E039 Hypothyroidism, unspecified: Secondary | ICD-10-CM

## 2021-07-08 LAB — TSH: TSH: 5.22 u[IU]/mL (ref 0.35–5.50)

## 2021-07-08 MED ORDER — LEVOTHYROXINE SODIUM 112 MCG PO TABS: 112.0000 ug | ORAL_TABLET | Freq: Every day | ORAL | 3 refills | Status: DC

## 2021-07-08 NOTE — Telephone Encounter (Signed)
Pt said his prescription didn't go through at the pharmacy and needs it called in again.   Levothyroxine  Pharmacy- [facility] [address]

## 2021-07-08 NOTE — Telephone Encounter (Signed)
Rx sent in

## 2021-07-27 ENCOUNTER — Telehealth: Payer: Self-pay | Admitting: Family Medicine

## 2021-07-27 MED ORDER — DOXYCYCLINE HYCLATE 100 MG PO CAPS: 100.0000 mg | ORAL_CAPSULE | Freq: Two times a day (BID) | ORAL | 0 refills | Status: DC

## 2021-07-27 NOTE — Telephone Encounter (Signed)
Pt on vacation at beach.  Earlier this week burned right foot with hot water.  Second degree and formed blister right away.   Burn on dorsum of foot.   Cleaning with soap and water.   As week has progressed, has noted swelling and increased redness.    No fever or chills.   NKDA.   Will send in Doxycycline 100 mg po bid and elevate frequently.   Touch base for fever or progressive redness.   Discussed risk of increased phototoxicity with Doxy.

## 2022-02-07 ENCOUNTER — Ambulatory Visit (INDEPENDENT_AMBULATORY_CARE_PROVIDER_SITE_OTHER): Payer: Self-pay | Admitting: Family Medicine

## 2022-02-07 ENCOUNTER — Encounter: Payer: Self-pay | Admitting: Family Medicine

## 2022-02-07 VITALS — BP 106/66 | HR 72 | Temp 97.9°F | Ht 70.0 in | Wt 179.4 lb

## 2022-02-07 DIAGNOSIS — Z1211 Encounter for screening for malignant neoplasm of colon: Secondary | ICD-10-CM

## 2022-02-09 NOTE — Progress Notes (Signed)
Pt states he was told that he must have physical to get medication for thyroid refilled.  He does not want physical at this time.    Also, labs up to date re: TSH (8/22) and he does not need refills at this time.   We did discuss setting up CPE later and he will consider  ?

## 2022-02-24 DIAGNOSIS — Z1211 Encounter for screening for malignant neoplasm of colon: Secondary | ICD-10-CM | POA: Diagnosis not present

## 2022-03-04 LAB — COLOGUARD: COLOGUARD: NEGATIVE

## 2022-06-05 ENCOUNTER — Other Ambulatory Visit: Payer: Self-pay

## 2022-06-05 ENCOUNTER — Telehealth: Payer: Self-pay | Admitting: Family Medicine

## 2022-06-05 DIAGNOSIS — E039 Hypothyroidism, unspecified: Secondary | ICD-10-CM

## 2022-06-05 MED ORDER — LEVOTHYROXINE SODIUM 112 MCG PO TABS: 112.0000 ug | ORAL_TABLET | Freq: Every day | ORAL | 0 refills | Status: DC

## 2022-06-05 NOTE — Telephone Encounter (Signed)
Spoke with patient.  Refill for Levothyroxine sent to [facility] on [address].    Patient has office appointment scheduled on 06/09/22, he has requested PSA labs, and TSH labs.

## 2022-06-05 NOTE — Telephone Encounter (Signed)
Pt called for a medication refill:  levothyroxine (SYNTHROID) 112 MCG tablet  Please send to:  [facility] [address], [place] - [address] Phone:  [phone]  Fax:  [phone]

## 2022-06-06 NOTE — Telephone Encounter (Signed)
Lab orders place.   Lvm for patient that requested labs has been placed and if choose to have drawn before OV appointment, to please call office to schedule lab appointment prior to OV appointment.

## 2022-06-06 NOTE — Addendum Note (Signed)
Addended by: Milus Mallick on: 06/06/2022 01:53 PM   Modules accepted: Orders

## 2022-06-09 ENCOUNTER — Encounter: Payer: Self-pay | Admitting: Family Medicine

## 2022-06-09 ENCOUNTER — Ambulatory Visit (INDEPENDENT_AMBULATORY_CARE_PROVIDER_SITE_OTHER): Payer: Medicare Other | Admitting: Family Medicine

## 2022-06-09 ENCOUNTER — Other Ambulatory Visit: Payer: Medicare Other

## 2022-06-09 VITALS — BP 122/70 | HR 70 | Temp 98.0°F | Ht 70.0 in | Wt 172.6 lb

## 2022-06-09 DIAGNOSIS — E039 Hypothyroidism, unspecified: Secondary | ICD-10-CM | POA: Diagnosis not present

## 2022-06-09 DIAGNOSIS — Z125 Encounter for screening for malignant neoplasm of prostate: Secondary | ICD-10-CM

## 2022-06-09 MED ORDER — LEVOTHYROXINE SODIUM 112 MCG PO TABS: 112.0000 ug | ORAL_TABLET | Freq: Every day | ORAL | 0 refills | Status: DC

## 2022-06-09 NOTE — Progress Notes (Signed)
   Established Patient Office Visit  Subjective   Patient ID: Raymond Morrow, male    DOB: 08-Aug-1956  Age: 66 y.o. MRN: [id number]  Chief Complaint  Patient presents with   Medication Consultation    HPI   Raymond Morrow is seen for follow-up regarding hypothyroidism.  He has been on thyroid replacement for years and currently takes levothyroxine 112 mcg daily.  Recent good compliance with therapy.  He called recently requesting labs with TSH and PSA.  Denies any recent obstructive urinary symptoms.  No recent PSA on file.  Generally feels well.  His wife is battling with cancer.  Currently stable.  Past Medical History:  Diagnosis Date   Anxiety    BPH (benign prostatic hyperplasia)    Thyroid disease    History reviewed. No pertinent surgical history.  reports that he has quit smoking. He has quit using smokeless tobacco. He reports current alcohol use. He reports that he does not use drugs. family history is not on file. No Known Allergies  Review of Systems  Constitutional:  Negative for chills, fever and weight loss.  Respiratory:  Negative for shortness of breath.   Cardiovascular:  Negative for chest pain.      Objective:     BP 122/70 (BP Location: Left Arm, Patient Position: Sitting, Cuff Size: Normal)   Pulse 70   Temp 98 F (36.7 C) (Oral)   Ht 5\' 10"  (1.778 m)   Wt 172 lb 9.6 oz (78.3 kg)   SpO2 98%   BMI 24.77 kg/m  BP Readings from Last 3 Encounters:  06/09/22 122/70  02/07/22 106/66  05/14/20 126/72   Wt Readings from Last 3 Encounters:  06/09/22 172 lb 9.6 oz (78.3 kg)  02/07/22 179 lb 6.4 oz (81.4 kg)  05/14/20 180 lb 14.4 oz (82.1 kg)      Physical Exam Vitals reviewed.  Constitutional:      Appearance: Normal appearance.  Cardiovascular:     Rate and Rhythm: Normal rate and regular rhythm.  Pulmonary:     Effort: Pulmonary effort is normal.     Breath sounds: Normal breath sounds.  Neurological:     General: No focal deficit present.      Mental Status: He is alert.      No results found for any visits on 06/09/22.    The ASCVD Risk score (Arnett DK, et al., 2019) failed to calculate for the following reasons:   Cannot find a previous HDL lab   Cannot find a previous total cholesterol lab    Assessment & Plan:   Problem List Items Addressed This Visit       Unprioritized   Hypothyroidism   Relevant Medications   levothyroxine (SYNTHROID) 112 MCG tablet  Recheck TSH today.  Patient also requesting PSA screening.  Refills provided for levothyroxine.  No follow-ups on file.    2020, MD

## 2022-06-09 NOTE — Addendum Note (Signed)
Addended by: Marian Sorrow D on: 06/09/2022 03:47 PM   Modules accepted: Orders

## 2022-06-10 LAB — PSA: PSA: 1.86 ng/mL (ref 0.10–4.00)

## 2022-06-10 LAB — TSH: TSH: 5.09 u[IU]/mL (ref 0.35–5.50)

## 2022-07-03 ENCOUNTER — Encounter: Payer: Self-pay | Admitting: Family Medicine

## 2022-07-03 ENCOUNTER — Ambulatory Visit (INDEPENDENT_AMBULATORY_CARE_PROVIDER_SITE_OTHER): Payer: Medicare Other | Admitting: Family Medicine

## 2022-07-03 VITALS — BP 120/80 | HR 71 | Temp 98.1°F | Wt 171.0 lb

## 2022-07-03 DIAGNOSIS — L299 Pruritus, unspecified: Secondary | ICD-10-CM | POA: Diagnosis not present

## 2022-07-03 DIAGNOSIS — B86 Scabies: Secondary | ICD-10-CM | POA: Diagnosis not present

## 2022-07-03 MED ORDER — PERMETHRIN 5 % EX CREA: TOPICAL_CREAM | CUTANEOUS | 0 refills | Status: DC

## 2022-07-03 NOTE — Progress Notes (Signed)
Subjective:    Patient ID: Raymond Morrow, male    DOB: 22-Jun-1956, 66 y.o.   MRN: [id number]  Chief Complaint  Patient presents with   Skin pruritis    Patient reports skin itches all over body. 4 days ago. Works with rugs.    HPI Patient was seen today for acute concern.  Pt with full body pruritis and scattered lesions concentrated below beltline and on thighs.  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  Tried a steroid ointment without improvement in symptoms.  Pt's wife does not have symptoms.  Past Medical History:  Diagnosis Date   Anxiety    BPH (benign prostatic hyperplasia)    Thyroid disease     No Known Allergies  ROS General: Denies fever, chills, night sweats, changes in weight, changes in appetite HEENT: Denies headaches, ear pain, changes in vision, rhinorrhea, sore throat CV: Denies CP, palpitations, SOB, orthopnea Pulm: Denies SOB, cough, wheezing GI: Denies abdominal pain, nausea, vomiting, diarrhea, constipation GU: Denies dysuria, hematuria, frequency Msk: Denies muscle cramps, joint pains Neuro: Denies weakness, numbness, tingling Skin: Denies rashes, bruising  +pruritis bumps Psych: Denies depression, anxiety, hallucinations     Objective:    Blood pressure 120/80, pulse 71, temperature 98.1 F (36.7 C), temperature source Oral, weight 171 lb (77.6 kg), SpO2 96 %.  Gen. Pleasant, well-nourished, in no distress, normal affect   HEENT: [place]/AT, face symmetric, conjunctiva clear, no scleral icterus, PERRLA, EOMI, nares patent without drainage Lungs: no accessory muscle use Cardiovascular: RRR,  no peripheral edema Musculoskeletal: No deformities, no cyanosis or clubbing, normal tone Neuro:  A&Ox3, CN II-XII intact, normal gait Skin:  Warm, dry, intact. Erythematous raised lesions some in linear distribution on Ues, 2 on R hand/wrist abdomen proximal to umbilicus.  Increased  concentrations on lower back, buttock, groin, and upper thighs.   Wt Readings from Last 3 Encounters:  07/03/22 171 lb (77.6 kg)  06/09/22 172 lb 9.6 oz (78.3 kg)  02/07/22 179 lb 6.4 oz (81.4 kg)    Lab Results  Component Value Date   WBC 4.2 08/20/2015   HGB 14.5 08/20/2015   HCT 42.7 08/20/2015   PLT 205.0 08/20/2015   GLUCOSE 93 08/20/2015   CHOL 190 08/20/2015   TRIG 83.0 08/20/2015   HDL 51.20 08/20/2015   LDLDIRECT 146.6 08/01/2008   LDLCALC 122 (H) 08/20/2015   ALT 23 08/20/2015   AST 22 08/20/2015   NA 140 08/20/2015   K 4.0 08/20/2015   CL 106 08/20/2015   CREATININE 0.87 08/20/2015   BUN 18 08/20/2015   CO2 27 08/20/2015   TSH 5.09 06/09/2022   PSA 1.86 06/09/2022    Assessment/Plan:  Scabies - Plan: permethrin (ELIMITE) 5 % cream  Pruritus  Concern for scabies or other mite causing symptoms.  Permethrin 5% cream.  Discussed cleaning clothes, etc.  Given handout.  F/u prn  Abbe Amsterdam, MD

## 2022-07-11 ENCOUNTER — Telehealth: Payer: Self-pay | Admitting: Family Medicine

## 2022-07-11 DIAGNOSIS — B86 Scabies: Secondary | ICD-10-CM

## 2022-07-11 MED ORDER — PERMETHRIN 5 % EX CREA: TOPICAL_CREAM | CUTANEOUS | 0 refills | Status: DC

## 2022-07-11 NOTE — Telephone Encounter (Signed)
Rx sent 

## 2022-07-11 NOTE — Addendum Note (Signed)
Addended by: Christy Sartorius on: 07/11/2022 01:15 PM   Modules accepted: Orders

## 2022-07-11 NOTE — Telephone Encounter (Signed)
Pt still experiencing symptoms from his visit on 07/03/22. Requesting refill of permethrin (ELIMITE) 5 % cream

## 2022-09-22 ENCOUNTER — Ambulatory Visit (INDEPENDENT_AMBULATORY_CARE_PROVIDER_SITE_OTHER): Payer: Medicare Other

## 2022-09-22 VITALS — Ht 70.0 in | Wt 170.0 lb

## 2022-09-22 DIAGNOSIS — Z0000 Encounter for general adult medical examination without abnormal findings: Secondary | ICD-10-CM

## 2022-09-22 NOTE — Patient Instructions (Signed)
Raymond Morrow , Thank you for taking time to come for your Medicare Wellness Visit. I appreciate your ongoing commitment to your health goals. Please review the following plan we discussed and let me know if I can assist you in the future.   Screening recommendations/referrals: Colonoscopy: cologuard 02/24/2022, due 02/24/2025 Recommended yearly ophthalmology/optometry visit for glaucoma screening and checkup Recommended yearly dental visit for hygiene and checkup  Vaccinations: Influenza vaccine: decline Pneumococcal vaccine: decline Tdap vaccine: decline Shingles vaccine: decline   Covid-19:  decline  Advanced directives: copy in chart  Conditions/risks identified: none  Next appointment: Follow up in one year for your annual wellness visit.   Preventive Care 66 Years and Older, Male Preventive care refers to lifestyle choices and visits with your health care provider that can promote health and wellness. What does preventive care include? A yearly physical exam. This is also called an annual well check. Dental exams once or twice a year. Routine eye exams. Ask your health care provider how often you should have your eyes checked. Personal lifestyle choices, including: Daily care of your teeth and gums. Regular physical activity. Eating a healthy diet. Avoiding tobacco and drug use. Limiting alcohol use. Practicing safe sex. Taking low doses of aspirin every day. Taking vitamin and mineral supplements as recommended by your health care provider. What happens during an annual well check? The services and screenings done by your health care provider during your annual well check will depend on your age, overall health, lifestyle risk factors, and family history of disease. Counseling  Your health care provider may ask you questions about your: Alcohol use. Tobacco use. Drug use. Emotional well-being. Home and relationship well-being. Sexual activity. Eating  habits. History of falls. Memory and ability to understand (cognition). Work and work Astronomer. Screening  You may have the following tests or measurements: Height, weight, and BMI. Blood pressure. Lipid and cholesterol levels. These may be checked every 5 years, or more frequently if you are over 60 years old. Skin check. Lung cancer screening. You may have this screening every year starting at age 43 if you have a 30-pack-year history of smoking and currently smoke or have quit within the past 15 years. Fecal occult blood test (FOBT) of the stool. You may have this test every year starting at age 72. Flexible sigmoidoscopy or colonoscopy. You may have a sigmoidoscopy every 5 years or a colonoscopy every 10 years starting at age 49. Prostate cancer screening. Recommendations will vary depending on your family history and other risks. Hepatitis C blood test. Hepatitis B blood test. Sexually transmitted disease (STD) testing. Diabetes screening. This is done by checking your blood sugar (glucose) after you have not eaten for a while (fasting). You may have this done every 1-3 years. Abdominal aortic aneurysm (AAA) screening. You may need this if you are a current or former smoker. Osteoporosis. You may be screened starting at age 83 if you are at high risk. Talk with your health care provider about your test results, treatment options, and if necessary, the need for more tests. Vaccines  Your health care provider may recommend certain vaccines, such as: Influenza vaccine. This is recommended every year. Tetanus, diphtheria, and acellular pertussis (Tdap, Td) vaccine. You may need a Td booster every 10 years. Zoster vaccine. You may need this after age 30. Pneumococcal 13-valent conjugate (PCV13) vaccine. One dose is recommended after age 76. Pneumococcal polysaccharide (PPSV23) vaccine. One dose is recommended after age 43. Talk to your health care provider  about which screenings and  vaccines you need and how often you need them. This information is not intended to replace advice given to you by your health care provider. Make sure you discuss any questions you have with your health care provider. Document Released: 12/14/2015 Document Revised: 08/06/2016 Document Reviewed: 09/18/2015 Elsevier Interactive Patient Education  2017 [place] Prevention in the Home Falls can cause injuries. They can happen to people of all ages. There are many things you can do to make your home safe and to help prevent falls. What can I do on the outside of my home? Regularly fix the edges of walkways and driveways and fix any cracks. Remove anything that might make you trip as you walk through a door, such as a raised step or threshold. Trim any bushes or trees on the path to your home. Use bright outdoor lighting. Clear any walking paths of anything that might make someone trip, such as rocks or tools. Regularly check to see if handrails are loose or broken. Make sure that both sides of any steps have handrails. Any raised decks and porches should have guardrails on the edges. Have any leaves, snow, or ice cleared regularly. Use sand or salt on walking paths during winter. Clean up any spills in your garage right away. This includes oil or grease spills. What can I do in the bathroom? Use night lights. Install grab bars by the toilet and in the tub and shower. Do not use towel bars as grab bars. Use non-skid mats or decals in the tub or shower. If you need to sit down in the shower, use a plastic, non-slip stool. Keep the floor dry. Clean up any water that spills on the floor as soon as it happens. Remove soap buildup in the tub or shower regularly. Attach bath mats securely with double-sided non-slip rug tape. Do not have throw rugs and other things on the floor that can make you trip. What can I do in the bedroom? Use night lights. Make sure that you have a light by your  bed that is easy to reach. Do not use any sheets or blankets that are too big for your bed. They should not hang down onto the floor. Have a firm chair that has side arms. You can use this for support while you get dressed. Do not have throw rugs and other things on the floor that can make you trip. What can I do in the kitchen? Clean up any spills right away. Avoid walking on wet floors. Keep items that you use a lot in easy-to-reach places. If you need to reach something above you, use a strong step stool that has a grab bar. Keep electrical cords out of the way. Do not use floor polish or wax that makes floors slippery. If you must use wax, use non-skid floor wax. Do not have throw rugs and other things on the floor that can make you trip. What can I do with my stairs? Do not leave any items on the stairs. Make sure that there are handrails on both sides of the stairs and use them. Fix handrails that are broken or loose. Make sure that handrails are as long as the stairways. Check any carpeting to make sure that it is firmly attached to the stairs. Fix any carpet that is loose or worn. Avoid having throw rugs at the top or bottom of the stairs. If you do have throw rugs, attach them to the floor  with carpet tape. Make sure that you have a light switch at the top of the stairs and the bottom of the stairs. If you do not have them, ask someone to add them for you. What else can I do to help prevent falls? Wear shoes that: Do not have high heels. Have rubber bottoms. Are comfortable and fit you well. Are closed at the toe. Do not wear sandals. If you use a stepladder: Make sure that it is fully opened. Do not climb a closed stepladder. Make sure that both sides of the stepladder are locked into place. Ask someone to hold it for you, if possible. Clearly mark and make sure that you can see: Any grab bars or handrails. First and last steps. Where the edge of each step is. Use tools that  help you move around (mobility aids) if they are needed. These include: Canes. Walkers. Scooters. Crutches. Turn on the lights when you go into a dark area. Replace any light bulbs as soon as they burn out. Set up your furniture so you have a clear path. Avoid moving your furniture around. If any of your floors are uneven, fix them. If there are any pets around you, be aware of where they are. Review your medicines with your doctor. Some medicines can make you feel dizzy. This can increase your chance of falling. Ask your doctor what other things that you can do to help prevent falls. This information is not intended to replace advice given to you by your health care provider. Make sure you discuss any questions you have with your health care provider. Document Released: 09/13/2009 Document Revised: 04/24/2016 Document Reviewed: 12/22/2014 Elsevier Interactive Patient Education  2017 [employer].

## 2022-09-22 NOTE — Progress Notes (Signed)
I connected with Raymond Morrow today by telephone and verified that I am speaking with the correct person using two identifiers. Location patient: home Location provider: work Persons participating in the virtual visit: Taj Nevins, Glenna Durand LPN.   I discussed the limitations, risks, security and privacy concerns of performing an evaluation and management service by telephone and the availability of in person appointments. I also discussed with the patient that there may be a patient responsible charge related to this service. The patient expressed understanding and verbally consented to this telephonic visit.    Interactive audio and video telecommunications were attempted between this provider and patient, however failed, due to patient having technical difficulties OR patient did not have access to video capability.  We continued and completed visit with audio only.     Vital signs may be patient reported or missing.  Subjective:   Raymond Morrow is a 66 y.o. male who presents for an Initial Medicare Annual Wellness Visit.  Review of Systems     Cardiac Risk Factors include: advanced age (>12men, >75 women)     Objective:    Today's Vitals   09/22/22 0811 09/22/22 0812  Weight: 170 lb (77.1 kg)   Height: 5\' 10"  (1.778 m)   PainSc:  2    Body mass index is 24.39 kg/m.     09/22/2022    8:17 AM 06/18/2015    9:39 PM  Advanced Directives  Does Patient Have a Medical Advance Directive? Yes No  Type of Paramedic of [place];Living will   Copy of [place] in Chart? Yes - validated most recent copy scanned in chart (See row information)     Current Medications (verified) Outpatient Encounter Medications as of 09/22/2022  Medication Sig   levothyroxine (SYNTHROID) 112 MCG tablet Take 1 tablet (112 mcg total) by mouth daily.   permethrin (ELIMITE) 5 % cream Apply cream to body.   Leave on for 8-14 hours then  wash with soap and water.  Repeat dose in 7 to 14 days if needed. (Patient not taking: Reported on 09/22/2022)   No facility-administered encounter medications on file as of 09/22/2022.    Allergies (verified) Patient has no known allergies.   History: Past Medical History:  Diagnosis Date   Anxiety    BPH (benign prostatic hyperplasia)    Thyroid disease    History reviewed. No pertinent surgical history. History reviewed. No pertinent family history. Social History   Socioeconomic History   Marital status: Married    Spouse name: Not on file   Number of children: Not on file   Years of education: Not on file   Highest education level: Not on file  Occupational History   Not on file  Tobacco Use   Smoking status: Former   Smokeless tobacco: Former  Substance and Sexual Activity   Alcohol use: Yes    Comment: occasionally   Drug use: No   Sexual activity: Not on file  Other Topics Concern   Not on file  Social History Narrative   Not on file   Social Determinants of Health   Financial Resource Strain: Low Risk  (09/22/2022)   Overall Financial Resource Strain (CARDIA)    Difficulty of Paying Living Expenses: Not hard at all  Food Insecurity: No Food Insecurity (09/22/2022)   Hunger Vital Sign    Worried About Running Out of Food in the Last Year: Never true    [place] in  the Last Year: Never true  Transportation Needs: No Transportation Needs (09/22/2022)   PRAPARE - Administrator, Civil Service (Medical): No    Lack of Transportation (Non-Medical): No  Physical Activity: Inactive (09/22/2022)   Exercise Vital Sign    Days of Exercise per Week: 0 days    Minutes of Exercise per Session: 0 min  Stress: No Stress Concern Present (09/22/2022)   [employer] of Occupational Health - Occupational Stress Questionnaire    Feeling of Stress : Not at all  Social Connections: Not on file    Tobacco Counseling Counseling given: Not  Answered   Clinical Intake:  Pre-visit preparation completed: Yes  Pain : 0-10 Pain Score: 2  Pain Type: Chronic pain Pain Location: Knee Pain Orientation: Right Pain Descriptors / Indicators: Aching Pain Onset: More than a month ago Pain Frequency: Constant     Nutritional Status: BMI of 19-24  Normal Nutritional Risks: None Diabetes: No  How often do you need to have someone help you when you read instructions, pamphlets, or other written materials from your doctor or pharmacy?: 1 - Never What is the last grade level you completed in school?: 74yr college  Diabetic? no  Interpreter Needed?: No  Information entered by :: NAllen LPN   Activities of Daily Living    09/22/2022    8:18 AM  In your present state of health, do you have any difficulty performing the following activities:  Hearing? 0  Vision? 0  Difficulty concentrating or making decisions? 0  Walking or climbing stairs? 0  Dressing or bathing? 0  Doing errands, shopping? 0  Preparing Food and eating ? N  Using the Toilet? N  In the past six months, have you accidently leaked urine? N  Do you have problems with loss of bowel control? N  Managing your Medications? N  Managing your Finances? N  Housekeeping or managing your Housekeeping? N    Patient Care Team: Kristian Covey, MD as PCP - General (Family Medicine)  Indicate any recent Medical Services you may have received from other than [facility] providers in the past year (date may be approximate).     Assessment:   This is a routine wellness examination for Raymond Morrow.  Hearing/Vision screen Vision Screening - Comments:: Regular eye exams, [facility]  Dietary issues and exercise activities discussed: Current Exercise Habits: The patient has a physically strenuous job, but has no regular exercise apart from work.   Goals Addressed             This Visit's Progress    Patient Stated       09/22/2022, continue to eat well        Depression Screen    09/22/2022    8:18 AM 07/03/2022    3:46 PM 06/09/2022    4:09 PM 02/07/2022    3:12 PM 05/14/2020   10:24 AM  PHQ 2/9 Scores  PHQ - 2 Score 0 0 0  0  PHQ- 9 Score  0     Exception Documentation    Patient refusal     Fall Risk    09/22/2022    8:18 AM 07/03/2022    3:47 PM 06/09/2022    4:52 PM  Fall Risk   Falls in the past year? 0 0 0  Number falls in past yr: 0 0 0  Injury with Fall? 0 0 0  Risk for fall due to : Medication side effect No Fall Risks No Fall  Risks  Follow up Falls prevention discussed;Education provided;Falls evaluation completed Falls evaluation completed Falls evaluation completed    FALL RISK PREVENTION PERTAINING TO THE HOME:  Any stairs in or around the home? Yes  If so, are there any without handrails? No  Home free of loose throw rugs in walkways, pet beds, electrical cords, etc? Yes  Adequate lighting in your home to reduce risk of falls? Yes   ASSISTIVE DEVICES UTILIZED TO PREVENT FALLS:  Life alert? No  Use of a cane, walker or w/c? No  Grab bars in the bathroom? No  Shower chair or bench in shower? No  Elevated toilet seat or a handicapped toilet? No   TIMED UP AND GO:  Was the test performed? No .      Cognitive Function:        09/22/2022    8:19 AM  6CIT Screen  What Year? 0 points  What month? 0 points  What time? 0 points  Count back from 20 0 points  Months in reverse 0 points  Repeat phrase 0 points  Total Score 0 points    Immunizations Immunization History  Administered Date(s) Administered   Td 08/26/2010    TDAP status: Due, Education has been provided regarding the importance of this vaccine. Advised may receive this vaccine at local pharmacy or Health Dept. Aware to provide a copy of the vaccination record if obtained from local pharmacy or Health Dept. Verbalized acceptance and understanding.  Flu Vaccine status: Declined, Education has been provided regarding the importance of this  vaccine but patient still declined. Advised may receive this vaccine at local pharmacy or Health Dept. Aware to provide a copy of the vaccination record if obtained from local pharmacy or Health Dept. Verbalized acceptance and understanding.  Pneumococcal vaccine status: Declined,  Education has been provided regarding the importance of this vaccine but patient still declined. Advised may receive this vaccine at local pharmacy or Health Dept. Aware to provide a copy of the vaccination record if obtained from local pharmacy or Health Dept. Verbalized acceptance and understanding.   Covid-19 vaccine status: Declined, Education has been provided regarding the importance of this vaccine but patient still declined. Advised may receive this vaccine at local pharmacy or Health Dept.or vaccine clinic. Aware to provide a copy of the vaccination record if obtained from local pharmacy or Health Dept. Verbalized acceptance and understanding.  Qualifies for Shingles Vaccine? Yes   Zostavax completed No   Shingrix Completed?: No.    Education has been provided regarding the importance of this vaccine. Patient has been advised to call insurance company to determine out of pocket expense if they have not yet received this vaccine. Advised may also receive vaccine at local pharmacy or Health Dept. Verbalized acceptance and understanding.  Screening Tests Health Maintenance  Topic Date Due   Hepatitis C Screening  Never done   COLONOSCOPY (Pts 45-9yrs Insurance coverage will need to be confirmed)  Never done   COVID-19 Vaccine (1) 10/08/2022 (Originally 03/23/1957)   Zoster Vaccines- Shingrix (1 of 2) 12/23/2022 (Originally 09/22/2006)   INFLUENZA VACCINE  03/01/2023 (Originally 07/01/2022)   Pneumonia Vaccine 9+ Years old (1 - PCV) 09/23/2023 (Originally 09/22/2021)   TETANUS/TDAP  09/23/2023 (Originally 08/26/2020)   HPV VACCINES  Aged Out    Health Maintenance  Health Maintenance Due  Topic Date Due    Hepatitis C Screening  Never done   COLONOSCOPY (Pts 45-37yrs Insurance coverage will need to be confirmed)  Never done  Colorectal cancer screening: Type of screening: Cologuard. Completed 02/24/2022. Repeat every 3 years  Lung Cancer Screening: (Low Dose CT Chest recommended if Age 20-80 years, 30 pack-year currently smoking OR have quit w/in 15years.) does not qualify.   Lung Cancer Screening Referral: no  Additional Screening:  Hepatitis C Screening: does qualify;  Vision Screening: Recommended annual ophthalmology exams for early detection of glaucoma and other disorders of the eye. Is the patient up to date with their annual eye exam?  Yes  Who is the provider or what is the name of the office in which the patient attends annual eye exams? [facility] If pt is not established with a provider, would they like to be referred to a provider to establish care? No .   Dental Screening: Recommended annual dental exams for proper oral hygiene  Community Resource Referral / Chronic Care Management: CRR required this visit?  No   CCM required this visit?  No      Plan:     I have personally reviewed and noted the following in the patient's chart:   Medical and social history Use of alcohol, tobacco or illicit drugs  Current medications and supplements including opioid prescriptions. Patient is not currently taking opioid prescriptions. Functional ability and status Nutritional status Physical activity Advanced directives List of other physicians Hospitalizations, surgeries, and ER visits in previous 12 months Vitals Screenings to include cognitive, depression, and falls Referrals and appointments  In addition, I have reviewed and discussed with patient certain preventive protocols, quality metrics, and best practice recommendations. A written personalized care plan for preventive services as well as general preventive health recommendations were provided to patient.      Kellie Simmering, LPN   [id number]   Nurse Notes: none  Due to this being a virtual visit, the after visit summary with patients personalized plan was offered to patient via mail or my-chart. Patient would like to access on my-chart

## 2022-09-25 DIAGNOSIS — M25511 Pain in right shoulder: Secondary | ICD-10-CM | POA: Diagnosis not present

## 2022-09-25 DIAGNOSIS — M25561 Pain in right knee: Secondary | ICD-10-CM | POA: Diagnosis not present

## 2022-10-14 DIAGNOSIS — M25561 Pain in right knee: Secondary | ICD-10-CM | POA: Diagnosis not present

## 2022-10-21 DIAGNOSIS — M25561 Pain in right knee: Secondary | ICD-10-CM | POA: Diagnosis not present

## 2022-10-27 DIAGNOSIS — M25561 Pain in right knee: Secondary | ICD-10-CM | POA: Diagnosis not present

## 2022-11-13 ENCOUNTER — Other Ambulatory Visit: Payer: Self-pay | Admitting: Family Medicine

## 2022-11-13 DIAGNOSIS — E039 Hypothyroidism, unspecified: Secondary | ICD-10-CM

## 2023-01-23 ENCOUNTER — Other Ambulatory Visit: Payer: Self-pay | Admitting: Family Medicine

## 2023-01-23 DIAGNOSIS — E039 Hypothyroidism, unspecified: Secondary | ICD-10-CM

## 2023-03-09 DIAGNOSIS — K080 Exfoliation of teeth due to systemic causes: Secondary | ICD-10-CM | POA: Diagnosis not present

## 2023-03-26 DIAGNOSIS — K080 Exfoliation of teeth due to systemic causes: Secondary | ICD-10-CM | POA: Diagnosis not present

## 2023-04-22 ENCOUNTER — Other Ambulatory Visit: Payer: Self-pay | Admitting: Family Medicine

## 2023-04-22 DIAGNOSIS — E039 Hypothyroidism, unspecified: Secondary | ICD-10-CM

## 2023-07-16 ENCOUNTER — Encounter (INDEPENDENT_AMBULATORY_CARE_PROVIDER_SITE_OTHER): Payer: Self-pay

## 2023-07-23 ENCOUNTER — Other Ambulatory Visit: Payer: Self-pay | Admitting: Family Medicine

## 2023-07-23 DIAGNOSIS — H903 Sensorineural hearing loss, bilateral: Secondary | ICD-10-CM | POA: Diagnosis not present

## 2023-07-23 DIAGNOSIS — H6993 Unspecified Eustachian tube disorder, bilateral: Secondary | ICD-10-CM | POA: Diagnosis not present

## 2023-07-23 DIAGNOSIS — E039 Hypothyroidism, unspecified: Secondary | ICD-10-CM

## 2023-08-07 ENCOUNTER — Ambulatory Visit (INDEPENDENT_AMBULATORY_CARE_PROVIDER_SITE_OTHER): Payer: Medicare Other | Admitting: Family Medicine

## 2023-08-07 ENCOUNTER — Encounter: Payer: Self-pay | Admitting: Family Medicine

## 2023-08-07 VITALS — BP 106/68 | HR 63 | Temp 97.7°F | Ht 70.08 in | Wt 174.7 lb

## 2023-08-07 DIAGNOSIS — Z125 Encounter for screening for malignant neoplasm of prostate: Secondary | ICD-10-CM

## 2023-08-07 DIAGNOSIS — E039 Hypothyroidism, unspecified: Secondary | ICD-10-CM | POA: Diagnosis not present

## 2023-08-07 DIAGNOSIS — Z0000 Encounter for general adult medical examination without abnormal findings: Secondary | ICD-10-CM

## 2023-08-07 DIAGNOSIS — Z23 Encounter for immunization: Secondary | ICD-10-CM | POA: Diagnosis not present

## 2023-08-07 DIAGNOSIS — Z136 Encounter for screening for cardiovascular disorders: Secondary | ICD-10-CM

## 2023-08-07 LAB — HEPATIC FUNCTION PANEL
ALT: 25 U/L (ref 0–53)
AST: 20 U/L (ref 0–37)
Albumin: 4.1 g/dL (ref 3.5–5.2)
Alkaline Phosphatase: 61 U/L (ref 39–117)
Bilirubin, Direct: 0.1 mg/dL (ref 0.0–0.3)
Total Bilirubin: 0.7 mg/dL (ref 0.2–1.2)
Total Protein: 6.8 g/dL (ref 6.0–8.3)

## 2023-08-07 LAB — BASIC METABOLIC PANEL
BUN: 13 mg/dL (ref 6–23)
CO2: 28 meq/L (ref 19–32)
Calcium: 9.4 mg/dL (ref 8.4–10.5)
Chloride: 104 mEq/L (ref 96–112)
Creatinine, Ser: 0.88 mg/dL (ref 0.40–1.50)
GFR: 89.38 mL/min (ref >60.00)
Glucose, Bld: 82 mg/dL (ref 70–99)
Potassium: 4.4 meq/L (ref 3.5–5.1)
Sodium: 139 meq/L (ref 135–145)

## 2023-08-07 LAB — LIPID PANEL
Cholesterol: 217 mg/dL — ABNORMAL HIGH (ref 0–200)
HDL: 53.5 mg/dL (ref >39.00)
LDL Cholesterol: 150 mg/dL — ABNORMAL HIGH (ref 0–99)
NonHDL: 163.89
Total CHOL/HDL Ratio: 4
Triglycerides: 70 mg/dL (ref 0.0–149.0)
VLDL: 14 mg/dL (ref 0.0–40.0)

## 2023-08-07 LAB — CBC WITH DIFFERENTIAL/PLATELET
Basophils Absolute: 0.1 10*3/uL (ref 0.0–0.1)
Basophils Relative: 2 % (ref 0.0–3.0)
Eosinophils Absolute: 0.3 10*3/uL (ref 0.0–0.7)
Eosinophils Relative: 6.1 % — ABNORMAL HIGH (ref 0.0–5.0)
HCT: 44.2 % (ref 39.0–52.0)
Hemoglobin: 14.5 g/dL (ref 13.0–17.0)
Lymphocytes Relative: 36.3 % (ref 12.0–46.0)
Lymphs Abs: 1.6 10*3/uL (ref 0.7–4.0)
MCHC: 32.8 g/dL (ref 30.0–36.0)
MCV: 92.5 fl (ref 78.0–100.0)
Monocytes Absolute: 0.5 10*3/uL (ref 0.1–1.0)
Monocytes Relative: 11.2 % (ref 3.0–12.0)
Neutro Abs: 2 10*3/uL (ref 1.4–7.7)
Neutrophils Relative %: 44.4 % (ref 43.0–77.0)
Platelets: 220 10*3/uL (ref 150.0–400.0)
RBC: 4.78 Mil/uL (ref 4.22–5.81)
RDW: 13.3 % (ref 11.5–15.5)
WBC: 4.4 10*3/uL (ref 4.0–10.5)

## 2023-08-07 LAB — TSH: TSH: 0.77 u[IU]/mL (ref 0.35–5.50)

## 2023-08-07 LAB — PSA: PSA: 1.87 ng/mL (ref 0.10–4.00)

## 2023-08-07 NOTE — Patient Instructions (Signed)
Consider trial of Flomax (Tamsulosin) for prostate symptoms

## 2023-08-07 NOTE — Progress Notes (Signed)
Established Patient Office Visit  Subjective   Patient ID: Raymond Morrow, male    DOB: 26-Jan-1956  Age: 67 y.o. MRN: [id number]  Chief Complaint  Patient presents with   Annual Exam    HPI   Raymond Morrow is seen today for physical exam.  Generally doing well.  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  He has 8 grandchildren and 1 great grandchild now.  Stays very busy with them as well.  He has hypothyroidism and is on replacement.  Due for follow-up labs.  Has had some occasional slow urinary stream and thinks he may have some mild prostate enlargement.  No significant nocturia.  PSA last year was normal.  No recent burning with urination.  His mom passed away this past year 7  Health maintenance reviewed  -Cologuard last year was negative. -Needs tetanus and does not need a Tdap at this time -Declines pneumonia vaccine, Shingrix, and influenza vaccine  Social history-married.  Has 8 grandchildren and 1 great grandchild.  Non-smoker.  No alcohol.  [employer] cleaning business.  Past Medical History:  Diagnosis Date   Anxiety    BPH (benign prostatic hyperplasia)    Thyroid disease    History reviewed. No pertinent surgical history.  reports that he has quit smoking. He has quit using smokeless tobacco. He reports current alcohol use. He reports that he does not use drugs. family history is not on file. No Known Allergies   Review of Systems  Constitutional:  Negative for chills, fever, malaise/fatigue and weight loss.  HENT:  Negative for hearing loss.   Eyes:  Negative for blurred vision and double vision.  Respiratory:  Negative for cough and shortness of breath.   Cardiovascular:  Negative for chest pain, palpitations and leg swelling.  Gastrointestinal:  Negative for abdominal pain, blood in stool, constipation and diarrhea.  Genitourinary:        See HPI  Skin:  Negative for rash.  Neurological:  Negative for dizziness, speech change,  seizures, loss of consciousness and headaches.  Psychiatric/Behavioral:  Negative for depression.       Objective:     BP 106/68 (BP Location: Left Arm, Patient Position: Sitting, Cuff Size: Normal)   Pulse 63   Temp 97.7 F (36.5 C) (Oral)   Ht 5' 10.08" (1.78 m)   Wt 174 lb 11.2 oz (79.2 kg)   SpO2 98%   BMI 25.01 kg/m  BP Readings from Last 3 Encounters:  08/07/23 106/68  07/03/22 120/80  06/09/22 122/70   Wt Readings from Last 3 Encounters:  08/07/23 174 lb 11.2 oz (79.2 kg)  09/22/22 170 lb (77.1 kg)  07/03/22 171 lb (77.6 kg)      Physical Exam Vitals reviewed.  Constitutional:      General: He is not in acute distress.    Appearance: He is well-developed.  HENT:     Head: Normocephalic and atraumatic.     Right Ear: External ear normal.     Left Ear: External ear normal.  Eyes:     Conjunctiva/sclera: Conjunctivae normal.     Pupils: Pupils are equal, round, and reactive to light.  Neck:     Thyroid: No thyromegaly.  Cardiovascular:     Rate and Rhythm: Normal rate and regular rhythm.     Heart sounds: Normal heart sounds. No murmur heard. Pulmonary:     Effort: No respiratory distress.     Breath sounds: No wheezing or rales.  Abdominal:     General: Bowel sounds are normal. There is no distension.     Palpations: Abdomen is soft. There is no mass.     Tenderness: There is no abdominal tenderness. There is no guarding or rebound.  Musculoskeletal:     Cervical back: Normal range of motion and neck supple.     Right lower leg: No edema.     Left lower leg: No edema.  Lymphadenopathy:     Cervical: No cervical adenopathy.  Skin:    Findings: No rash.  Neurological:     Mental Status: He is alert and oriented to person, place, and time.     Cranial Nerves: No cranial nerve deficit.      No results found for any visits on 08/07/23.    The ASCVD Risk score (Arnett DK, et al., 2019) failed to calculate for the following reasons:   Cannot find  a previous HDL lab   Cannot find a previous total cholesterol lab    Assessment & Plan:   Problem List Items Addressed This Visit       Unprioritized   Hypothyroidism   Relevant Orders   TSH   Other Visit Diagnoses     Physical exam    -  Primary   Relevant Orders   Basic metabolic panel   Lipid panel   CBC with Differential/Platelet   Hepatic function panel   PSA   Need for tetanus booster       Relevant Orders   Tdap vaccine greater than or equal to 7yo IM (Completed)     Healthy 67 year old male.  He has history of hypothyroidism and is on replacement.  Due for follow-up TSH.  We discussed several health maintenance issues as follows  -Cologuard was negative last year.  Consider follow-up in a couple years -Discussed flu vaccine, Shingrix, and pneumonia vaccines and he declines all of these -Does agree to Tdap, especially important with newborn great-grandchild -We did discuss possible trial of tamsulosin for slow urinary stream if symptoms persist.  He will consider -Obtain labs as above including TSH with his history of hypothyroidism  No follow-ups on file.    Evelena Peat, MD

## 2023-08-21 ENCOUNTER — Other Ambulatory Visit: Payer: Self-pay | Admitting: Family Medicine

## 2023-08-21 DIAGNOSIS — E039 Hypothyroidism, unspecified: Secondary | ICD-10-CM

## 2023-08-25 DIAGNOSIS — K080 Exfoliation of teeth due to systemic causes: Secondary | ICD-10-CM | POA: Diagnosis not present

## 2023-09-09 DIAGNOSIS — K080 Exfoliation of teeth due to systemic causes: Secondary | ICD-10-CM | POA: Diagnosis not present

## 2023-09-24 ENCOUNTER — Ambulatory Visit: Payer: Medicare Other | Admitting: Family Medicine

## 2023-09-24 DIAGNOSIS — Z0000 Encounter for general adult medical examination without abnormal findings: Secondary | ICD-10-CM

## 2023-09-24 NOTE — Progress Notes (Signed)
 Patient unable to obtain vital signs due to telehealth visit

## 2023-09-24 NOTE — Patient Instructions (Signed)
I really enjoyed getting to talk with you today! I am available on Tuesdays and Thursdays for virtual visits if you have any questions or concerns, or if I can be of any further assistance.   CHECKLIST FROM ANNUAL WELLNESS VISIT:  -Follow up (please call to schedule if not scheduled after visit):   -yearly for annual wellness visit with primary care office  Here is a list of your preventive care/health maintenance measures and the plan for each if any are due:  PLAN For any measures below that may be due:  -let us know if you want to do the hepatitis C screening  Health Maintenance  Topic Date Due   Hepatitis C Screening  Never done   COVID-19 Vaccine (1 - 2023-24 season) 10/10/2023 (Originally 08/02/2023)   Zoster Vaccines- Shingrix (1 of 2) 11/06/2023 (Originally 09/22/2006)   INFLUENZA VACCINE  02/29/2024 (Originally 07/02/2023)   Pneumonia Vaccine 67+ Years old (1 of 1 - PCV) 09/23/2024 (Originally 09/22/2021)   Medicare Annual Wellness (AWV)  09/23/2024   Fecal DNA (Cologuard)  02/24/2025   DTaP/Tdap/Td (3 - Td or Tdap) 08/06/2033   HPV VACCINES  Aged Out    -See a dentist at least yearly  -Get your eyes checked and then per your eye specialist's recommendations  -Other issues addressed today:   -I have included below further information regarding a healthy whole foods based diet, physical activity guidelines for adults, stress management and opportunities for social connections. I hope you find this information useful.   -----------------------------------------------------------------------------------------------------------------------------------------------------------------------------------------------------------------------------------------------------------  NUTRITION: -eat real food: lots of colorful vegetables (half the plate) and fruits -5-7 servings of vegetables and fruits per day (fresh or steamed is best), exp. 2 servings of vegetables with lunch and dinner  and 2 servings of fruit per day. Berries and greens such as kale and collards are great choices.  -consume on a regular basis: whole grains (make sure first ingredient on label contains the word "whole"), fresh fruits, fish, nuts, seeds, healthy oils (such as olive oil, avocado oil, grape seed oil) -may eat small amounts of dairy and lean meat on occasion, but avoid processed meats such as ham, bacon, lunch meat, etc. -drink water -try to avoid fast food and pre-packaged foods, processed meat -most experts advise limiting sodium to < 2300mg  per day, should limit further is any chronic conditions such as high blood pressure, heart disease, diabetes, etc. The American Heart Association advised that < 1500mg  is is ideal -try to avoid foods that contain any ingredients with names you do not recognize  -try to avoid sugar/sweets (except for the natural sugar that occurs in fresh fruit) -try to avoid sweet drinks -try to avoid white rice, white bread, pasta (unless whole grain), white or yellow potatoes  EXERCISE GUIDELINES FOR ADULTS: -if you wish to increase your physical activity, do so gradually and with the approval of your doctor -STOP and seek medical care immediately if you have any chest pain, chest discomfort or trouble breathing when starting or increasing exercise  -move and stretch your body, legs, feet and arms when sitting for long periods -Physical activity guidelines for optimal health in adults: -least 150 minutes per week of aerobic exercise (can talk, but not sing) once approved by your doctor, 20-30 minutes of sustained activity or two 10 minute episodes of sustained activity every day.  -resistance training at least 2 days per week if approved by your doctor -balance exercises 3+ days per week:   Stand somewhere where you have something  sturdy to hold onto if you lose balance.    1) lift up on toes, start with 5x per day and work up to 20x   2) stand and lift on leg straight out  to the side so that foot is a few inches of the floor, start with 5x each side and work up to 20x each side   3) stand on one foot, start with 5 seconds each side and work up to 20 seconds on each side  If you need ideas or help with getting more active:  -Silver sneakers https://tools.silversneakers.com  -Walk with a Doc: http://www.duncan-williams.com/  -try to include resistance (weight lifting/strength building) and balance exercises twice per week: or the following link for ideas: http://castillo-powell.com/  BuyDucts.dk  STRESS MANAGEMENT: -can try meditating, or just sitting quietly with deep breathing while intentionally relaxing all parts of your body for 5 minutes daily -if you need further help with stress, anxiety or depression please follow up with your primary doctor or contact the wonderful folks at [facility]: [phone]  SOCIAL CONNECTIONS: -options in [place] if you wish to engage in more social and exercise related activities:  -Silver sneakers https://tools.silversneakers.com  -Walk with a Doc: http://www.duncan-williams.com/  -Check out the [facility] Active Adults 50+ section on the [place] [employer] (hiking clubs, book clubs, cards and games, chess, exercise classes, aquatic classes and much more) - see the website for details: https://www.[place].gov/departments/parks-recreation/active-adults50  -YouTube has lots of exercise videos for different ages and abilities as well  -[facility] (a variety of indoor and outdoor inperson activities for adults). [phone]. [address]  -Virtual Online Classes (a variety of topics): see seniorplanet.org or call [phone]  -consider volunteering at a school, hospice center, church, senior center or elsewhere

## 2023-09-24 NOTE — Progress Notes (Signed)
PATIENT CHECK-IN and HEALTH RISK ASSESSMENT QUESTIONNAIRE:  -completed by phone/video for upcoming Medicare Preventive Visit  Pre-Visit Check-in: 1)Vitals (height, wt, BP, etc) - record in vitals section for visit on day of visit Request home vitals (wt, BP, etc.) and enter into vitals, THEN update Vital Signs SmartPhrase below at the top of the HPI. See below.  2)Review and Update Medications, Allergies PMH, Surgeries, Social history in Epic 3)Hospitalizations in the last year with date/reason? No  4)Review and Update Care Team (patient's specialists) in Epic 5) Complete PHQ9 in Epic  6) Complete Fall Screening in Epic 7)Review all Health Maintenance Due and order under PCP if not done.  Medicare Wellness Patient Questionnaire:  Answer theses question about your habits: Do you drink alcohol? Yes If yes, how many drinks do you have a day?1  Have you ever smoked?Yes  Quit date if applicable?  46 years   How many packs a day do/did you smoke? Less than 1  Do you use smokeless tobacco?No Do you use an illicit drugs?No Do you exercises? 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 Are you sexually active? Yes Number of partners? Wife does the cooking and tries to cook healthy, lots of fruits and veggies, they avoid the processed foods Typical breakfast: Varies Typical lunch: Varies  Typical dinner: Varies  Typical snacks: N/A   Beverages: Water  Answer theses question about you: Can you perform most household chores? Yes Do you find it hard to follow a conversation in a noisy room?No Do you often ask people to speak up or repeat themselves?Sometimes, had a hearing check up, was told was pretty normal Do you feel that you have a problem with memory? No Do you balance your checkbook and or bank acounts? N/A  Do you feel safe at home? Yes Last dentist visit? 1 month  ago Do you need assistance with any of the following: Please note if so No  Driving?  Feeding yourself?  Getting from bed to chair?  Getting to the toilet?  Bathing or showering?  Dressing yourself?  Managing money?  Climbing a flight of stairs  Preparing meals?    Do you have Advanced Directives in place (Living Will, Healthcare Power or Attorney)?  Yes   Last eye Exam and location? 1 year ago    Do you currently use prescribed or non-prescribed narcotic or opioid pain medications? No  Do you have a history or close family history of breast, ovarian, tubal or peritoneal cancer or a family member with BRCA (breast cancer susceptibility 1 and 2) gene mutations? No  Nurse/Assistant Credentials/time stamp: Mg 4:22 PM   ----------------------------------------------------------------------------------------------------------------------------------------------------------------------------------------------------------------------  Because this visit was a virtual/telehealth visit, some criteria may be missing or patient reported. Any vitals not documented were not able to be obtained and vitals that have been documented are patient reported.    MEDICARE ANNUAL PREVENTIVE CARE VISIT WITH PROVIDER (Welcome to Medicare, initial annual wellness or annual wellness exam)  Virtual Visit via Phone Note  I connected with Raymond Morrow on 09/24/23  by phone and verified that I am speaking with the correct person using two identifiers.  Location patient: home Location provider:work or home office Persons participating in the virtual visit: patient, provider  Concerns and/or follow up today: stable, no concerns   See HM section in Epic for other details of completed HM.  ROS: negative for report of fevers, unintentional weight loss, vision changes, vision loss, hearing loss or change, chest pain, sob, hemoptysis, melena, hematochezia, hematuria, falls, bleeding or bruising,  thoughts of suicide or self harm, memory loss  Patient-completed extensive health risk assessment - reviewed and discussed with the patient: See Health Risk Assessment completed with patient prior to the visit either above or in recent phone note. This was reviewed in detailed with the patient today and appropriate recommendations, orders and referrals were placed as needed per Summary below and patient instructions.   Review of Medical History: -PMH, PSH, Family History and current specialty and care providers reviewed and updated and listed below   Patient Care Team: Kristian Covey, MD as PCP - General (Family Medicine)   Past Medical History:  Diagnosis Date   Anxiety    BPH (benign prostatic hyperplasia)    Thyroid disease     No past surgical history on file.  Social History   Socioeconomic History   Marital status: Married    Spouse name: Not on file   Number of children: Not on file   Years of education: Not on file   Highest education level: Not on file  Occupational History   Not on file  Tobacco Use   Smoking status: Former   Smokeless tobacco: Former  Substance and Sexual Activity   Alcohol use: Yes    Comment: occasionally   Drug use: No   Sexual activity: Not on file  Other Topics Concern   Not on file  Social History Narrative   Not on file   Social Determinants of Health   Financial Resource Strain: Low Risk  (09/24/2023)   Overall Financial Resource Strain (CARDIA)    Difficulty of Paying Living Expenses: Not hard at all  Food Insecurity: Low Risk  (07/23/2023)   Received from [facility]   Hunger Vital Sign    Worried About Running Out of Food in the Last Year: Never true    Ran Out of Food in the Last Year: Never true  Transportation Needs: No Transportation Needs (07/23/2023)   Received from [facility]    In the past 12 months, has lack of reliable transportation kept you from medical appointments, meetings, work or  from getting things needed for daily living? : No  Physical Activity: Sufficiently Active (09/24/2023)   Exercise Vital Sign    Days of Exercise per Week: 7 days    Minutes of Exercise per Session: 30 min  Stress: No Stress Concern Present (09/24/2023)   [employer] of Occupational Health - Occupational Stress Questionnaire    Feeling of Stress : Not at all  Social Connections: Moderately Integrated (09/24/2023)   Social Connection and Isolation Panel [NHANES]    Frequency of Communication with Friends and Family: More than three times a week    Frequency of Social Gatherings with Friends and Family: Three times a week    Attends Religious Services: More than 4 times per year    Active Member of Clubs or Organizations: No    Attends Banker Meetings: Never    Marital Status: Married  Catering manager Violence: Not At Risk (09/24/2023)   Humiliation, Afraid, Rape, and Kick questionnaire    Fear of Current or Ex-Partner: No    Emotionally Abused: No    Physically Abused: No    Sexually Abused: No    No family history on file.  Current Outpatient Medications on File Prior to  Visit  Medication Sig Dispense Refill   levothyroxine (SYNTHROID) 112 MCG tablet TAKE 1 TABLET BY MOUTH ONCE DAILY . APPOINTMENT REQUIRED FOR FUTURE REFILLS 90 tablet 2   No current facility-administered medications on file prior to visit.    No Known Allergies     Physical Exam Vitals requested from patient and listed below if patient had equipment and was able to obtain at home for this virtual visit: There were no vitals filed for this visit. Estimated body mass index is 25.01 kg/m as calculated from the following:   Height as of 08/07/23: 5' 10.08" (1.78 m).   Weight as of 08/07/23: 174 lb 11.2 oz (79.2 kg).  EKG (optional): deferred due to virtual visit  GENERAL: alert, oriented, no acute distress detected; full vision exam deferred due to pandemic and/or virtual  encounter  PSYCH/NEURO: pleasant and cooperative, no obvious depression or anxiety, speech and thought processing grossly intact, Cognitive function grossly intact  Flowsheet Row Office Visit from 07/03/2022 in [facility] [place]  PHQ-9 Total Score 0           09/24/2023    4:11 PM 09/22/2022    8:18 AM 07/03/2022    3:46 PM 06/09/2022    4:09 PM 05/14/2020   10:24 AM  Depression screen PHQ 2/9  Decreased Interest 0 0 0 0 0  Down, Depressed, Hopeless 0 0 0 0 0  PHQ - 2 Score 0 0 0 0 0  Altered sleeping   0    Tired, decreased energy   0    Change in appetite   0    Feeling bad or failure about yourself    0    Trouble concentrating   0    Moving slowly or fidgety/restless   0    Suicidal thoughts   0    PHQ-9 Score   0         06/09/2022    4:52 PM 07/03/2022    3:47 PM 09/22/2022    8:18 AM 09/24/2023    4:11 PM  Fall Risk  Falls in the past year? 0 0 0 0  Was there an injury with Fall? 0 0 0 0  Fall Risk Category Calculator 0 0 0 0  Fall Risk Category (Retired) Low Low Low   (RETIRED) Patient Fall Risk Level Low fall risk Low fall risk Low fall risk   Patient at Risk for Falls Due to No Fall Risks No Fall Risks Medication side effect No Fall Risks  Fall risk Follow up Falls evaluation completed Falls evaluation completed Falls prevention discussed;Education provided;Falls evaluation completed Falls evaluation completed     SUMMARY AND PLAN:  Encounter for Medicare annual wellness exam   Discussed applicable health maintenance/preventive health measures and advised and referred or ordered per patient preferences: -declined all vaccines -discussed hep c screening and advised to let us know if he wants to do Health Maintenance  Topic Date Due   Hepatitis C Screening  Never done   COVID-19 Vaccine (1 - 2023-24 season) 10/10/2023 (Originally 08/02/2023)   Zoster Vaccines- Shingrix (1 of 2) 11/06/2023 (Originally 09/22/2006)   INFLUENZA VACCINE   02/29/2024 (Originally 07/02/2023)   Pneumonia Vaccine 29+ Years old (1 of 1 - PCV) 09/23/2024 (Originally 09/22/2021)   Medicare Annual Wellness (AWV)  09/23/2024   Fecal DNA (Cologuard)  02/24/2025   DTaP/Tdap/Td (3 - Td or Tdap) 08/06/2033   HPV VACCINES  Aged [employer] and  counseling on the following was provided based on the above review of health and a plan/checklist for the patient, along with additional information discussed, was provided for the patient in the patient instructions :   -Advised and counseled on a healthy lifestyle  -Reviewed patient's current diet. Advised and counseled on a whole foods based healthy diet. A summary of a healthy diet was provided in the Patient Instructions.  -reviewed patient's current physical activity level and discussed exercise guidelines for adults. Discussed community resources and ideas for safe exercise at home to assist in meeting exercise guideline recommendations in a safe and healthy way.  -Advise yearly dental visits at minimum and regular eye exams -Advised and counseled on alcohol safe limits  Follow up: see patient instructions   Patient Instructions  I really enjoyed getting to talk with you today! I am available on Tuesdays and Thursdays for virtual visits if you have any questions or concerns, or if I can be of any further assistance.   CHECKLIST FROM ANNUAL WELLNESS VISIT:  -Follow up (please call to schedule if not scheduled after visit):   -yearly for annual wellness visit with primary care office  Here is a list of your preventive care/health maintenance measures and the plan for each if any are due:  PLAN For any measures below that may be due:  -let us know if you want to do the hepatitis C screening  Health Maintenance  Topic Date Due   Hepatitis C Screening  Never done   COVID-19 Vaccine (1 - 2023-24 season) 10/10/2023 (Originally 08/02/2023)   Zoster Vaccines- Shingrix (1 of 2) 11/06/2023 (Originally  09/22/2006)   INFLUENZA VACCINE  02/29/2024 (Originally 07/02/2023)   Pneumonia Vaccine 61+ Years old (1 of 1 - PCV) 09/23/2024 (Originally 09/22/2021)   Medicare Annual Wellness (AWV)  09/23/2024   Fecal DNA (Cologuard)  02/24/2025   DTaP/Tdap/Td (3 - Td or Tdap) 08/06/2033   HPV VACCINES  Aged Out    -See a dentist at least yearly  -Get your eyes checked and then per your eye specialist's recommendations  -Other issues addressed today:   -I have included below further information regarding a healthy whole foods based diet, physical activity guidelines for adults, stress management and opportunities for social connections. I hope you find this information useful.   -----------------------------------------------------------------------------------------------------------------------------------------------------------------------------------------------------------------------------------------------------------  NUTRITION: -eat real food: lots of colorful vegetables (half the plate) and fruits -5-7 servings of vegetables and fruits per day (fresh or steamed is best), exp. 2 servings of vegetables with lunch and dinner and 2 servings of fruit per day. Berries and greens such as kale and collards are great choices.  -consume on a regular basis: whole grains (make sure first ingredient on label contains the word "whole"), fresh fruits, fish, nuts, seeds, healthy oils (such as olive oil, avocado oil, grape seed oil) -may eat small amounts of dairy and lean meat on occasion, but avoid processed meats such as ham, bacon, lunch meat, etc. -drink water -try to avoid fast food and pre-packaged foods, processed meat -most experts advise limiting sodium to < 2300mg  per day, should limit further is any chronic conditions such as high blood pressure, heart disease, diabetes, etc. The American Heart Association advised that < 1500mg  is is ideal -try to avoid foods that contain any ingredients with  names you do not recognize  -try to avoid sugar/sweets (except for the natural sugar that occurs in fresh fruit) -try to avoid sweet drinks -try to avoid white rice, white bread, pasta (  unless whole grain), white or yellow potatoes  EXERCISE GUIDELINES FOR ADULTS: -if you wish to increase your physical activity, do so gradually and with the approval of your doctor -STOP and seek medical care immediately if you have any chest pain, chest discomfort or trouble breathing when starting or increasing exercise  -move and stretch your body, legs, feet and arms when sitting for long periods -Physical activity guidelines for optimal health in adults: -least 150 minutes per week of aerobic exercise (can talk, but not sing) once approved by your doctor, 20-30 minutes of sustained activity or two 10 minute episodes of sustained activity every day.  -resistance training at least 2 days per week if approved by your doctor -balance exercises 3+ days per week:   Stand somewhere where you have something sturdy to hold onto if you lose balance.    1) lift up on toes, start with 5x per day and work up to 20x   2) stand and lift on leg straight out to the side so that foot is a few inches of the floor, start with 5x each side and work up to 20x each side   3) stand on one foot, start with 5 seconds each side and work up to 20 seconds on each side  If you need ideas or help with getting more active:  -Silver sneakers https://tools.silversneakers.com  -Walk with a Doc: http://www.duncan-williams.com/  -try to include resistance (weight lifting/strength building) and balance exercises twice per week: or the following link for ideas: http://castillo-powell.com/  BuyDucts.dk  STRESS MANAGEMENT: -can try meditating, or just sitting quietly with deep breathing while intentionally relaxing all parts of your body for 5 minutes  daily -if you need further help with stress, anxiety or depression please follow up with your primary doctor or contact the wonderful folks at [facility]: [phone]  SOCIAL CONNECTIONS: -options in [place] if you wish to engage in more social and exercise related activities:  -Silver sneakers https://tools.silversneakers.com  -Walk with a Doc: http://www.duncan-williams.com/  -Check out the [facility] Active Adults 50+ section on the [place] [employer] (hiking clubs, book clubs, cards and games, chess, exercise classes, aquatic classes and much more) - see the website for details: https://www.[place].gov/departments/parks-recreation/active-adults50  -YouTube has lots of exercise videos for different ages and abilities as well  -[facility] (a variety of indoor and outdoor inperson activities for adults). [phone]. [address]  -Virtual Online Classes (a variety of topics): see seniorplanet.org or call [phone]) 427-6724  -consider volunteering at a school, hospice center, church, senior center or elsewhere           Terressa Koyanagi, DO

## 2024-03-23 DIAGNOSIS — K080 Exfoliation of teeth due to systemic causes: Secondary | ICD-10-CM | POA: Diagnosis not present

## 2024-04-12 DIAGNOSIS — K080 Exfoliation of teeth due to systemic causes: Secondary | ICD-10-CM | POA: Diagnosis not present

## 2024-05-24 ENCOUNTER — Other Ambulatory Visit: Payer: Self-pay | Admitting: Family Medicine

## 2024-05-24 DIAGNOSIS — E039 Hypothyroidism, unspecified: Secondary | ICD-10-CM

## 2024-09-15 ENCOUNTER — Other Ambulatory Visit: Payer: Self-pay | Admitting: Family Medicine

## 2024-09-15 DIAGNOSIS — E039 Hypothyroidism, unspecified: Secondary | ICD-10-CM

## 2024-09-22 ENCOUNTER — Encounter: Admitting: Family Medicine

## 2024-09-22 NOTE — Progress Notes (Signed)
 Error - called multiple times and was unable to reach

## 2024-10-18 ENCOUNTER — Other Ambulatory Visit: Payer: Self-pay | Admitting: Family Medicine

## 2024-10-18 DIAGNOSIS — E039 Hypothyroidism, unspecified: Secondary | ICD-10-CM

## 2024-10-31 ENCOUNTER — Ambulatory Visit: Payer: Self-pay | Admitting: Family Medicine

## 2024-10-31 ENCOUNTER — Ambulatory Visit: Admitting: Family Medicine

## 2024-10-31 VITALS — BP 102/70 | HR 74 | Temp 97.8°F | Ht 70.08 in | Wt 179.3 lb

## 2024-10-31 DIAGNOSIS — E039 Hypothyroidism, unspecified: Secondary | ICD-10-CM

## 2024-10-31 LAB — TSH: TSH: 1.7 u[IU]/mL (ref 0.35–5.50)

## 2024-10-31 MED ORDER — LEVOTHYROXINE SODIUM 112 MCG PO TABS: ORAL_TABLET | ORAL | 3 refills | Status: AC

## 2024-10-31 NOTE — Progress Notes (Signed)
 Established Patient Office Visit  Subjective   Patient ID: Raymond Morrow, male    DOB: 1956-05-27  Age: 68 y.o. MRN: [id number]  Chief Complaint  Patient presents with   Medical Management of Chronic Issues    Pt is here to follow up on thyroid . Requesting 90 days refill.     HPI   Raymond Morrow is seen for follow-up regarding hypothyroidism.  He is treated with levothyroxine  112 mcg daily.  Needs follow-up labs.  Compliant with medication.  Generally feels well.  Appetite and weight stable.  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  We discussed preventative vaccines such as influenza and pneumonia and he declines.  Past Medical History:  Diagnosis Date   Anxiety    BPH (benign prostatic hyperplasia)    Thyroid  disease    No past surgical history on file.  reports that he has quit smoking. He has quit using smokeless tobacco. He reports current alcohol use. He reports that he does not use drugs. family history is not on file. No Known Allergies  Review of Systems  Constitutional:  Negative for weight loss.  Respiratory:  Negative for shortness of breath.   Cardiovascular:  Negative for chest pain.      Objective:     BP 102/70 (BP Location: Right Arm, Patient Position: Sitting, Cuff Size: Normal)   Pulse 74   Temp 97.8 F (36.6 C) (Oral)   Ht 5' 10.08 (1.78 m)   Wt 179 lb 4.8 oz (81.3 kg)   SpO2 96%   BMI 25.67 kg/m  BP Readings from Last 3 Encounters:  10/31/24 102/70  08/07/23 106/68  07/03/22 120/80   Wt Readings from Last 3 Encounters:  10/31/24 179 lb 4.8 oz (81.3 kg)  08/07/23 174 lb 11.2 oz (79.2 kg)  09/22/22 170 lb (77.1 kg)      Physical Exam Vitals reviewed.  Constitutional:      General: He is not in acute distress.    Appearance: He is not ill-appearing.  Cardiovascular:     Rate and Rhythm: Normal rate and regular rhythm.  Pulmonary:     Effort: Pulmonary effort is normal.     Breath sounds: Normal breath sounds.   Neurological:     Mental Status: He is alert.      No results found for any visits on 10/31/24.  Last CBC Lab Results  Component Value Date   WBC 4.4 08/07/2023   HGB 14.5 08/07/2023   HCT 44.2 08/07/2023   MCV 92.5 08/07/2023   MCH 30.5 06/18/2015   RDW 13.3 08/07/2023   PLT 220.0 08/07/2023   Last metabolic panel Lab Results  Component Value Date   GLUCOSE 82 08/07/2023   NA 139 08/07/2023   K 4.4 08/07/2023   CL 104 08/07/2023   CO2 28 08/07/2023   BUN 13 08/07/2023   CREATININE 0.88 08/07/2023   GFR 89.38 08/07/2023   CALCIUM 9.4 08/07/2023   PROT 6.8 08/07/2023   ALBUMIN 4.1 08/07/2023   BILITOT 0.7 08/07/2023   ALKPHOS 61 08/07/2023   AST 20 08/07/2023   ALT 25 08/07/2023   ANIONGAP 7 06/18/2015   Last lipids Lab Results  Component Value Date   CHOL 217 (H) 08/07/2023   HDL 53.50 08/07/2023   LDLCALC 150 (H) 08/07/2023   LDLDIRECT 146.6 08/01/2008   TRIG 70.0 08/07/2023   CHOLHDL 4 08/07/2023   Last hemoglobin A1c No results found for: HGBA1C Last thyroid  functions Lab Results  Component Value  Date   TSH 1.70 10/31/2024      The 10-year ASCVD risk score (Arnett DK, et al., 2019) is: 11%    Assessment & Plan:   Problem List Items Addressed This Visit       Unprioritized   Hypothyroidism - Primary   Relevant Medications   levothyroxine  (SYNTHROID ) 112 MCG tablet   Other Relevant Orders   TSH  Longstanding history of hypothyroidism.  Compliant with therapy.  Check TSH today.  Refill levothyroxine  for 1 year.  Discussed flu vaccine and he declines  No follow-ups on file.    Wolm Scarlet, MD

## 2024-12-13 ENCOUNTER — Ambulatory Visit: Admitting: Family Medicine

## 2024-12-13 DIAGNOSIS — Z0000 Encounter for general adult medical examination without abnormal findings: Secondary | ICD-10-CM

## 2024-12-13 NOTE — Progress Notes (Signed)
 " ----------------------------------------------------------------------------------------------------------------------------------------------------------------------------------------------------------------------  Because this visit was a virtual/telehealth visit, some criteria may be missing or patient reported. Any vitals not documented were not able to be obtained and vitals that have been documented are patient reported.    MEDICARE ANNUAL PREVENTIVE CARE VISIT WITH PROVIDER (Welcome to Medicare, initial annual wellness or annual wellness exam)  Virtual Visit via Phone Note  I connected with Raymond Morrow on 12/13/2024  by phone and verified that I am speaking with the correct person using two identifiers.  Location patient: home Location provider:work or home office Persons participating in the virtual visit: patient, provider  Concerns and/or follow up today: detailed intake and health/risks assessment completed on flow sheets and below- please see for details.   How often do you have a drink containing alcohol? Few times a month How many drinks containing alcohol do you have on a typical day when you are drinking?1 glass of wine How often do you have six or more drinks on one occasion? never Have you ever smoked? Former smoker Quit date if applicable? Over 40 years ago  How many packs a day do/did you smoke? na Do you use smokeless tobacco?n Do you use an illicit drugs?n Do you feel safe at home?y Last dentist visit?6 months ago, goes 1x per year Last eye Exam and location? 1-2 years ago   See HM section in Epic for other details of completed HM.    ROS: negative for report of fevers, unintentional weight loss, vision changes, vision loss, hearing loss or change, chest pain, sob, hemoptysis, melena, hematochezia, hematuria,bleeding or bruising  Patient-completed extensive health risk assessment - reviewed and discussed with the patient: See Health Risk Assessment  completed with patient prior to the visit either above or in recent phone note. This was reviewed in detailed with the patient today and appropriate recommendations, orders and referrals were placed as needed per Summary below and patient instructions.   Review of Medical History: -PMH, PSH, Family History and current specialty and care providers reviewed and updated and listed below   Patient Care Team: Micheal Wolm ORN, MD as PCP - General (Family Medicine)   Past Medical History:  Diagnosis Date   Anxiety    BPH (benign prostatic hyperplasia)    Thyroid  disease     No past surgical history on file.  Social History   Socioeconomic History   Marital status: Married    Spouse name: Not on file   Number of children: Not on file   Years of education: Not on file   Highest education level: Not on file  Occupational History   Not on file  Tobacco Use   Smoking status: Former   Smokeless tobacco: Former  Substance and Sexual Activity   Alcohol use: Yes    Comment: occasionally   Drug use: No   Sexual activity: Not on file  Other Topics Concern   Not on file  Social History Narrative   Not on file   Social Drivers of Health   Tobacco Use: Medium Risk (08/07/2023)   Patient History    Smoking Tobacco Use: Former    Smokeless Tobacco Use: Former    Passive Exposure: Not on Actuary Strain: Low Risk (09/24/2023)   Overall Financial Resource Strain (CARDIA)    Difficulty of Paying Living Expenses: Not hard at all  Food Insecurity: Low Risk (07/23/2023)   Received from [facility]   Epic    Within the past 12 months, you worried  that your food would run out before you got money to buy more: Never true    Within the past 12 months, the food you bought just didn't last and you didn't have money to get more. : Never true  Transportation Needs: No Transportation Needs (07/23/2023)   [facility] from [facility]   Transportation    In the past 12 months, has  lack of reliable transportation kept you from medical appointments, meetings, work or from getting things needed for daily living? : No  Physical Activity: Insufficiently Active (12/13/2024)   Exercise Vital Sign    Days of Exercise per Week: 4 days    Minutes of Exercise per Session: 30 min  Stress: No Stress Concern Present (12/13/2024)   [employer] of Occupational Health - Occupational Stress Questionnaire    Feeling of Stress: Not at all  Social Connections: Socially Integrated (12/13/2024)   Social Connection and Isolation Panel    Frequency of Communication with Friends and Family: More than three times a week    Frequency of Social Gatherings with Friends and Family: More than three times a week    Attends Religious Services: More than 4 times per year    Active Member of [employer] or Organizations: Yes    Attends Banker Meetings: More than 4 times per year    Marital Status: Married  Catering Manager Violence: Not At Risk (09/24/2023)   Humiliation, Afraid, Rape, and Kick questionnaire    Fear of Current or Ex-Partner: No    Emotionally Abused: No    Physically Abused: No    Sexually Abused: No  Depression (PHQ2-9): Low Risk (12/13/2024)   Depression (PHQ2-9)    PHQ-2 Score: 0  Alcohol Screen: Low Risk (09/24/2023)   Alcohol Screen    Last Alcohol Screening Score (AUDIT): 2  Housing: Low Risk (09/24/2023)   Housing    Last Housing Risk Score: 0  Utilities: Low Risk (07/23/2023)   Received from [facility]   Utilities    In the past 12 months has the electric, gas, oil, or water company threatened to shut off services in your home? : No  Health Literacy: Adequate Health Literacy (09/24/2023)   B1300 Health Literacy    Frequency of need for help with medical instructions: Never    No family history on file.  Medications Ordered Prior to Encounter[1]  Allergies[2]     Physical Exam Vitals requested from patient and listed below if patient had  equipment and was able to obtain at home for this virtual visit: There were no vitals filed for this visit. Estimated body mass index is 25.67 kg/m as calculated from the following:   Height as of 10/31/24: 5' 10.08 (1.78 m).   Weight as of 10/31/24: 179 lb 4.8 oz (81.3 kg).  EKG (optional): deferred due to virtual visit  GENERAL: alert, oriented, no acute distress detected; full vision exam deferred due to pandemic and/or virtual encounter  PSYCH/NEURO: pleasant and cooperative, no obvious depression or anxiety, speech and thought processing grossly intact, Cognitive function grossly intact  Flowsheet Row Office Visit from 07/03/2022 [facility]  PHQ-9 Total Score 0        12/13/2024    5:23 PM 10/31/2024    1:42 PM 09/24/2023    4:11 PM 09/22/2022    8:18 AM 07/03/2022    3:46 PM  Depression screen PHQ 2/9  Decreased Interest 0 0 0 0 0  Down, Depressed, Hopeless 0 0  0 0 0  PHQ - 2 Score 0 0 0 0 0  Altered sleeping     0  Tired, decreased energy     0  Change in appetite     0  Feeling bad or failure about yourself      0  Trouble concentrating     0  Moving slowly or fidgety/restless     0  Suicidal thoughts     0  PHQ-9 Score     0      Data saved with a previous flowsheet row definition       06/09/2022    4:52 PM 07/03/2022    3:47 PM 09/22/2022    8:18 AM 09/24/2023    4:11 PM 12/13/2024    5:08 PM  Fall Risk  Falls in the past year? 0 0 0 0 0  Was there an injury with Fall? 0  0  0  0  0  Fall Risk Category Calculator 0 0 0 0 0  Fall Risk Category (Retired) Low  Low  Low     (RETIRED) Patient Fall Risk Level Low fall risk  Low fall risk  Low fall risk     Patient at Risk for Falls Due to No Fall Risks No Fall Risks Medication side effect No Fall Risks Other (Comment)  Fall risk Follow up Falls evaluation completed  Falls evaluation completed  Falls prevention discussed;Education provided;Falls evaluation completed  Falls evaluation  completed Falls evaluation completed     Data saved with a previous flowsheet row definition     SUMMARY AND PLAN:  Encounter for Medicare annual wellness exam  Discussed applicable health maintenance/preventive health measures and advised and referred or ordered per patient preferences: -declines immunizations Health Maintenance  Topic Date Due   Hepatitis C Screening  Never done   COVID-19 Vaccine (1 - 2025-26 season) Never done   Zoster Vaccines- Shingrix (1 of 2) 01/29/2025 (Originally 09/22/2006)   Influenza Vaccine  02/28/2025 (Originally 07/01/2024)   Pneumococcal Vaccine: 50+ Years (1 of 1 - PCV) 10/31/2025 (Originally 09/22/2006)   Fecal DNA (Cologuard)  02/24/2025   Medicare Annual Wellness (AWV)  12/13/2025   DTaP/Tdap/Td (3 - Td or Tdap) 08/06/2033   Meningococcal B Vaccine  Aged Out     Education and counseling on the following was provided based on the above review of health and a plan/checklist for the patient, along with additional information discussed, was provided for the patient in the patient instructions :  -Advised to bring copies of advanced directives -Provided counseling and plan for difficulty hearing  -Advised and counseled on a healthy lifestyle - including the importance of a healthy diet, regular physical activity, social connections and stress management. -Reviewed patient's current diet. Advised and counseled on a whole foods based healthy diet. Congratulated on healthy choices. A summary of a healthy diet was provided in the Patient Instructions.  -reviewed patient's current physical activity level and discussed exercise guidelines for adults. Discussed community resources and ideas for safe exercise at home to assist in meeting exercise guideline recommendations in a safe and healthy way.  -Advise yearly dental visits at minimum and regular eye exams -Advised and counseled on alcohol safe limits Follow up: see patient instructions   Patient  Instructions  I really enjoyed getting to talk with you today! I am available on Tuesdays and Thursdays for virtual visits if you have any questions or concerns, or if I can be of any further assistance.  CHECKLIST FROM ANNUAL WELLNESS VISIT:  -Follow up (please call to schedule if not scheduled after visit):   -yearly for annual wellness visit with primary care office  Here is a list of your preventive care/health maintenance measures and the plan for each if any are due:  PLAN For any measures below that may be due:    1. Please bring a copy of advanced directives to Dr. Micheal.   2. If you wish to do Hepatitis C screening please request at next appointment.   3. If you decide to do any of the vaccines, can do at the pharmacy. Please let us  know if you do so that we can update your record.   Health Maintenance  Topic Date Due   Hepatitis C Screening  Never done   COVID-19 Vaccine (1 - 2025-26 season) Never done   Medicare Annual Wellness (AWV)  09/23/2024   Zoster Vaccines- Shingrix (1 of 2) 01/29/2025 (Originally 09/22/2006)   Influenza Vaccine  02/28/2025 (Originally 07/01/2024)   Pneumococcal Vaccine: 50+ Years (1 of 1 - PCV) 10/31/2025 (Originally 09/22/2006)   Fecal DNA (Cologuard)  02/24/2025   DTaP/Tdap/Td (3 - Td or Tdap) 08/06/2033   Meningococcal B Vaccine  Aged Out    -See a dentist at least yearly  -Get your eyes checked and then per your eye specialist's recommendations  -Other issues addressed today:   -I have included below further information regarding a healthy whole foods based diet, physical activity guidelines for adults, stress management and opportunities for social connections. I hope you find this information useful.    -----------------------------------------------------------------------------------------------------------------------------------------------------------------------------------------------------------------------------------------------------------    NUTRITION: -eat real food: lots of colorful vegetables (half the plate) and fruits -5-7 servings of vegetables and fruits per day (fresh or steamed is best), exp. 2 servings of vegetables with lunch and dinner and 2 servings of fruit per day. Berries and greens such as kale and collards are great choices.  -consume on a regular basis:  fresh fruits, fresh veggies, fish, nuts, seeds, healthy oils (such as olive oil, avocado oil), whole grains (make sure for bread/pasta/crackers/etc., that the first ingredient on label contains the word whole), legumes. -can eat small amounts of dairy and lean meat (no larger than the palm of your hand), but avoid processed meats such as ham, bacon, lunch meat, etc. -drink water -try to avoid fast food and pre-packaged foods, processed meat, ultra processed foods/beverages (donuts, candy, etc.) -most experts advise limiting sodium to < 2300mg  per day, should limit further is any chronic conditions such as high blood pressure, heart disease, diabetes, etc. The American Heart Association advised that < 1500mg  is is ideal -try to avoid foods/beverages that contain any ingredients with names you do not recognize  -try to avoid foods/beverages  with added sugar or sweeteners/sweets  -try to avoid sweet drinks (including diet drinks): soda, juice, Gatorade, sweet tea, power drinks, diet drinks -try to avoid white rice, white bread, pasta (unless whole grain)  EXERCISE GUIDELINES FOR ADULTS: -if you wish to increase your physical activity, do so gradually and with the approval of your doctor -STOP and seek medical care immediately if you have any chest pain, chest discomfort or trouble breathing when starting or  increasing exercise  -move and stretch your body, legs, feet and arms when sitting for long periods -Physical activity guidelines for optimal health in adults: -get at least 150 minutes per week of moderate exercise (can talk, but not sing); this is about 20-30 minutes of sustained activity 5-7 days  per week or two 10-15 minute episodes of sustained activity 5-7 days per week -do some muscle building/resistance training/strength training at least 2 days per week  -balance exercises 3+ days per week:   Stand somewhere where you have something sturdy to hold onto if you lose balance    1) lift up on toes, then back down, start with 5x per day and work up to 20x   2) stand and lift one leg straight out to the side so that foot is a few inches of the floor, start with 5x each side and work up to 20x each side   3) stand on one foot, start with 5 seconds each side and work up to 20 seconds on each side  If you need ideas or help with getting more active:  -Silver sneakers https://tools.silversneakers.com  -Walk with a Doc: Http://www.duncan-williams.com/  -try to include resistance (weight lifting/strength building) and balance exercises twice per week: or the following link for ideas: http://castillo-powell.com/  buyducts.dk  STRESS MANAGEMENT: -can try meditating, or just sitting quietly with deep breathing while intentionally relaxing all parts of your body for 5 minutes daily -if you need further help with stress, anxiety or depression please follow up with your primary doctor or contact the wonderful folks at [facility]: [phone]  SOCIAL CONNECTIONS: -options in [place] if you wish to engage in more social and exercise related activities:  -Silver sneakers https://tools.silversneakers.com  -Walk with a Doc: Http://www.duncan-williams.com/  -Check out the [facility] Active Adults 50+  section on the [place] of [employer] companies (hiking clubs, book clubs, cards and games, chess, exercise classes, aquatic classes and much more) - see the website for details: https://www.[place].gov/departments/parks-recreation/active-adults50  -YouTube has lots of exercise videos for different ages and abilities as well  -[facility] (a variety of indoor and outdoor inperson activities for adults). (530)171-9428. [address]  -Virtual Online Classes (a variety of topics): see seniorplanet.org or call [phone]  -consider volunteering at a school, hospice center, church, senior center or elsewhere            Chiquita JONELLE Cramp, DO     [1]  Current Outpatient Medications on File Prior to Visit  Medication Sig Dispense Refill   levothyroxine  (SYNTHROID ) 112 MCG tablet TAKE 1 TABLET BY MOUTH ONCE DAILY . 90 tablet 3   No current facility-administered medications on file prior to visit.  [2] No Known Allergies  "

## 2024-12-13 NOTE — Patient Instructions (Signed)
 I really enjoyed getting to talk with you today! I am available on Tuesdays and Thursdays for virtual visits if you have any questions or concerns, or if I can be of any further assistance.   CHECKLIST FROM ANNUAL WELLNESS VISIT:  -Follow up (please call to schedule if not scheduled after visit):   -yearly for annual wellness visit with primary care office  Here is a list of your preventive care/health maintenance measures and the plan for each if any are due:  PLAN For any measures below that may be due:    1. Please bring a copy of advanced directives to Dr. Micheal.   2. If you wish to do Hepatitis C screening please request at next appointment.   3. If you decide to do any of the vaccines, can do at the pharmacy. Please let us  know if you do so that we can update your record.   Health Maintenance  Topic Date Due   Hepatitis C Screening  Never done   COVID-19 Vaccine (1 - 2025-26 season) Never done   Medicare Annual Wellness (AWV)  09/23/2024   Zoster Vaccines- Shingrix (1 of 2) 01/29/2025 (Originally 09/22/2006)   Influenza Vaccine  02/28/2025 (Originally 07/01/2024)   Pneumococcal Vaccine: 50+ Years (1 of 1 - PCV) 10/31/2025 (Originally 09/22/2006)   Fecal DNA (Cologuard)  02/24/2025   DTaP/Tdap/Td (3 - Td or Tdap) 08/06/2033   Meningococcal B Vaccine  Aged Out    -See a dentist at least yearly  -Get your eyes checked and then per your eye specialist's recommendations  -Other issues addressed today:   -I have included below further information regarding a healthy whole foods based diet, physical activity guidelines for adults, stress management and opportunities for social connections. I hope you find this information useful.    -----------------------------------------------------------------------------------------------------------------------------------------------------------------------------------------------------------------------------------------------------------    NUTRITION: -eat real food: lots of colorful vegetables (half the plate) and fruits -5-7 servings of vegetables and fruits per day (fresh or steamed is best), exp. 2 servings of vegetables with lunch and dinner and 2 servings of fruit per day. Berries and greens such as kale and collards are great choices.  -consume on a regular basis:  fresh fruits, fresh veggies, fish, nuts, seeds, healthy oils (such as olive oil, avocado oil), whole grains (make sure for bread/pasta/crackers/etc., that the first ingredient on label contains the word whole), legumes. -can eat small amounts of dairy and lean meat (no larger than the palm of your hand), but avoid processed meats such as ham, bacon, lunch meat, etc. -drink water -try to avoid fast food and pre-packaged foods, processed meat, ultra processed foods/beverages (donuts, candy, etc.) -most experts advise limiting sodium to < 2300mg  per day, should limit further is any chronic conditions such as high blood pressure, heart disease, diabetes, etc. The American Heart Association advised that < 1500mg  is is ideal -try to avoid foods/beverages that contain any ingredients with names you do not recognize  -try to avoid foods/beverages  with added sugar or sweeteners/sweets  -try to avoid sweet drinks (including diet drinks): soda, juice, Gatorade, sweet tea, power drinks, diet drinks -try to avoid white rice, white bread, pasta (unless whole grain)  EXERCISE GUIDELINES FOR ADULTS: -if you wish to increase your physical activity, do so gradually and with the approval of your doctor -STOP and seek medical care immediately if you have any chest pain, chest discomfort or trouble breathing when starting or  increasing exercise  -move and stretch your body, legs, feet and arms when  sitting for long periods -Physical activity guidelines for optimal health in adults: -get at least 150 minutes per week of moderate exercise (can talk, but not sing); this is about 20-30 minutes of sustained activity 5-7 days per week or two 10-15 minute episodes of sustained activity 5-7 days per week -do some muscle building/resistance training/strength training at least 2 days per week  -balance exercises 3+ days per week:   Stand somewhere where you have something sturdy to hold onto if you lose balance    1) lift up on toes, then back down, start with 5x per day and work up to 20x   2) stand and lift one leg straight out to the side so that foot is a few inches of the floor, start with 5x each side and work up to 20x each side   3) stand on one foot, start with 5 seconds each side and work up to 20 seconds on each side  If you need ideas or help with getting more active:  -Silver sneakers https://tools.silversneakers.com  -Walk with a Doc: Http://www.duncan-williams.com/  -try to include resistance (weight lifting/strength building) and balance exercises twice per week: or the following link for ideas: http://castillo-powell.com/  buyducts.dk  STRESS MANAGEMENT: -can try meditating, or just sitting quietly with deep breathing while intentionally relaxing all parts of your body for 5 minutes daily -if you need further help with stress, anxiety or depression please follow up with your primary doctor or contact the wonderful folks at [facility]: [phone]  SOCIAL CONNECTIONS: -options in [place] if you wish to engage in more social and exercise related activities:  -Silver sneakers https://tools.silversneakers.com  -Walk with a Doc: Http://www.duncan-williams.com/  -Check out the [facility] Active Adults 50+  section on the [place] of [employer] (hiking clubs, book clubs, cards and games, chess, exercise classes, aquatic classes and much more) - see the website for details: https://www.[place].gov/departments/parks-recreation/active-adults50  -YouTube has lots of exercise videos for different ages and abilities as well  -[facility] (a variety of indoor and outdoor inperson activities for adults). [phone]. [address]  -Virtual Online Classes (a variety of topics): see seniorplanet.org or call [phone]  -consider volunteering at a school, hospice center, church, senior center or elsewhere

## 2024-12-19 ENCOUNTER — Other Ambulatory Visit: Payer: Self-pay

## 2024-12-19 ENCOUNTER — Emergency Department (HOSPITAL_BASED_OUTPATIENT_CLINIC_OR_DEPARTMENT_OTHER): Admitting: Radiology

## 2024-12-19 ENCOUNTER — Emergency Department (HOSPITAL_BASED_OUTPATIENT_CLINIC_OR_DEPARTMENT_OTHER)
Admission: EM | Admit: 2024-12-19 | Discharge: 2024-12-19 | Disposition: A | Discharge status: Home / Self Care | Attending: Emergency Medicine | Admitting: Emergency Medicine

## 2024-12-19 ENCOUNTER — Ambulatory Visit: Payer: Self-pay

## 2024-12-19 ENCOUNTER — Encounter (HOSPITAL_BASED_OUTPATIENT_CLINIC_OR_DEPARTMENT_OTHER): Payer: Self-pay | Admitting: Radiology

## 2024-12-19 DIAGNOSIS — R0600 Dyspnea, unspecified: Secondary | ICD-10-CM | POA: Diagnosis not present

## 2024-12-19 DIAGNOSIS — R0602 Shortness of breath: Secondary | ICD-10-CM | POA: Diagnosis present

## 2024-12-19 LAB — TROPONIN T, HIGH SENSITIVITY
Troponin T High Sensitivity: <15 ng/L (ref 0–19)
Troponin T High Sensitivity: <15 ng/L (ref 0–19)

## 2024-12-19 LAB — BASIC METABOLIC PANEL WITH GFR
Anion gap: 12 (ref 5–15)
BUN: 13 mg/dL (ref 8–23)
CO2: 21 mmol/L — ABNORMAL LOW (ref 22–32)
Calcium: 9.6 mg/dL (ref 8.9–10.3)
Chloride: 105 mmol/L (ref 98–111)
Creatinine, Ser: 0.82 mg/dL (ref 0.61–1.24)
GFR, Estimated: >60 mL/min
Glucose, Bld: 92 mg/dL (ref 70–99)
Potassium: 4 mmol/L (ref 3.5–5.1)
Sodium: 139 mmol/L (ref 135–145)

## 2024-12-19 LAB — CBC
HCT: 39.4 % (ref 39.0–52.0)
Hemoglobin: 13.5 g/dL (ref 13.0–17.0)
MCH: 30.6 pg (ref 26.0–34.0)
MCHC: 34.3 g/dL (ref 30.0–36.0)
MCV: 89.3 fL (ref 80.0–100.0)
Platelets: 206 K/uL (ref 150–400)
RBC: 4.41 MIL/uL (ref 4.22–5.81)
RDW: 12.5 % (ref 11.5–15.5)
WBC: 5.6 K/uL (ref 4.0–10.5)
nRBC: 0 % (ref 0.0–0.2)

## 2024-12-19 LAB — D-DIMER, QUANTITATIVE: D-Dimer, Quant: 0.39 ug{FEU}/mL (ref 0.00–0.50)

## 2024-12-19 NOTE — Telephone Encounter (Signed)
 FYI Only or Action Required?: FYI only for provider: ED advised.  Patient was last seen in primary care on 12/13/2024 by Luke Chiquita SAUNDERS, DO.  Called Nurse Triage reporting Dizziness.  Symptoms began today.  Interventions attempted: Nothing.  Symptoms are: unchanged.  Triage Disposition: Go to ED Now (Notify PCP), See Physician Within 24 Hours  Patient/caregiver understands and will follow disposition?: Yes              Message from [facility] sent at 12/19/2024  4:10 PM EST  Reason for Triage: Dizzy, SOB. Would like to come in today to see pcp   Reason for Disposition  [1] MODERATE dizziness (e.g., interferes with normal activities) AND [2] has NOT been evaluated by doctor (or NP/PA) for this  (Exception: Dizziness caused by heat exposure, sudden standing, or poor fluid intake.)  Difficulty breathing  Answer Assessment - Initial Assessment Questions 1. DESCRIPTION: Describe your dizziness.     Dizziness today while at work   2. LIGHTHEADED: Do you feel lightheaded? (e.g., somewhat faint, woozy, weak upon standing)     Lightheadedness.   3. VERTIGO: Do you feel like either you or the room is spinning or tilting? (i.e., vertigo)     No   4. SEVERITY: How bad is it?  Do you feel like you are going to faint? Can you stand and walk?     Mild  5. ONSET:  When did the dizziness begin?     Today  6. AGGRAVATING FACTORS: Does anything make it worse? (e.g., standing, change in head position)     Nothing, sitting makes it better        8. CAUSE: What do you think is causing the dizziness? (e.g., decreased fluids or food, diarrhea, emotional distress, heat exposure, new medicine, sudden standing, vomiting; unknown)     Unsure   9. RECURRENT SYMPTOM: Have you had dizziness before? If Yes, ask: When was the last time? What happened that time?     No   10. OTHER SYMPTOMS: Do you have any other symptoms? (e.g., fever, chest pain, vomiting, diarrhea,  bleeding)       SOB     Patient called in to triage with complaints of dizziness, lightheadedness, SOB-mild/moderate. This has been ongoing since earlier today while he was at work.  The patient stated he was a work when symptoms appeared. SOB is intermittent and has happed x 8 last year. But he is unsure of the cause. The lightheadedness is present now, along with the SOB. He reports feeling like he will faint earlier.    Referred to ED, wife will drive him now. Patient agrees with the plan of care, and will reach out if symptoms worsen or persist.  Protocols used: Dizziness - Lightheadedness-A-AH

## 2024-12-19 NOTE — Telephone Encounter (Signed)
 Spoke with patient.  He is actually in ER at [facility] getting evaluated currently.  Wolm LELON Scarlet MD [facility]

## 2024-12-19 NOTE — ED Notes (Signed)
 Pt states that all his symptoms have resolved 45 minutes ago and he prefers no to have a second troponin

## 2024-12-19 NOTE — ED Notes (Addendum)
 Pt called 2x for trop and vitals

## 2024-12-19 NOTE — ED Provider Notes (Signed)
 " [facility] AT [facility] Provider Note   CSN: [id number] Arrival date & time: 12/19/24  1622     Patient presents with: Shortness of Breath and Dizziness   Raymond Morrow is a 69 y.o. male.    Shortness of Breath Dizziness Associated symptoms: shortness of breath      Pt states he had an episode of shortness of breath where he did not feel like his lung would expand fully.  Pt has had 10-15 episodes over the past year.  Sometimes it occurs at night.  It can last a couple of hours.  Today he had an episode where he also felt lightheaded.  He had to sit down but the symptoms persisted.  Eventually the symptoms resolved while he was waiting in the ED.    Prior to Admission medications  Medication Sig Start Date End Date Taking? Authorizing Provider  levothyroxine  (SYNTHROID ) 112 MCG tablet TAKE 1 TABLET BY MOUTH ONCE DAILY . 10/31/24   Burchette, Wolm ORN, MD    Allergies: Patient has no known allergies.    Review of Systems  Respiratory:  Positive for shortness of breath.   Neurological:  Positive for dizziness.    Updated Vital Signs BP 122/80 (BP Location: Right Arm)   Pulse 60   Temp 98.4 F (36.9 C) (Oral)   Resp 18   Ht 1.778 m (5' 10)   Wt 79.4 kg   SpO2 100%   BMI 25.11 kg/m   Physical Exam Vitals and nursing note reviewed.  Constitutional:      General: He is not in acute distress.    Appearance: He is well-developed.  HENT:     Head: Normocephalic and atraumatic.     Right Ear: External ear normal.     Left Ear: External ear normal.  Eyes:     General: No scleral icterus.       Right eye: No discharge.        Left eye: No discharge.     Conjunctiva/sclera: Conjunctivae normal.  Neck:     Trachea: No tracheal deviation.  Cardiovascular:     Rate and Rhythm: Normal rate and regular rhythm.  Pulmonary:     Effort: Pulmonary effort is normal. No respiratory distress.     Breath sounds: Normal breath sounds. No  stridor. No wheezing or rales.  Abdominal:     General: Bowel sounds are normal. There is no distension.     Palpations: Abdomen is soft.     Tenderness: There is no abdominal tenderness. There is no guarding or rebound.  Musculoskeletal:        General: No tenderness or deformity.     Cervical back: Neck supple.  Skin:    General: Skin is warm and dry.     Findings: No rash.  Neurological:     General: No focal deficit present.     Mental Status: He is alert.     Cranial Nerves: No cranial nerve deficit, dysarthria or facial asymmetry.     Sensory: No sensory deficit.     Motor: No abnormal muscle tone or seizure activity.     Coordination: Coordination normal.  Psychiatric:        Mood and Affect: Mood normal.     (all labs ordered are listed, but only abnormal results are displayed) Labs Reviewed  BASIC METABOLIC PANEL WITH GFR - Abnormal; Notable for the following components:      Result Value   CO2 21 (*)  All other components within normal limits  CBC  D-DIMER, QUANTITATIVE  TROPONIN T, HIGH SENSITIVITY  TROPONIN T, HIGH SENSITIVITY    EKG: EKG Interpretation Date/Time:  Monday December 19 2024 16:44:19 EST Ventricular Rate:  57 PR Interval:  203 QRS Duration:  85 QT Interval:  388 QTC Calculation: 378 R Axis:   87  Text Interpretation: Sinus rhythm Borderline right axis deviation Confirmed by Randol Simmonds [phone]) on 12/19/2024 4:47:42 PM  Radiology: [facility] Chest 2 View Result Date: 12/19/2024 EXAM: 2 VIEW(S) XRAY OF THE CHEST 12/19/2024 05:29:06 PM COMPARISON: None available. CLINICAL HISTORY: shortness of breath shortness of breath shortness of breath shortness of breath shortness of breath FINDINGS: LUNGS AND PLEURA: Calcified granuloma in left lung base. No pleural effusion. No pneumothorax. HEART AND MEDIASTINUM: No acute abnormality of the cardiac and mediastinal silhouettes. BONES AND SOFT TISSUES: Mild degenerative changes in thoracic spine. IMPRESSION: 1. No  acute findings. 2. Calcified granuloma in the left lung base. Electronically signed by: Greig Pique MD 12/19/2024 05:42 PM EST RP Workstation: [id number]     Procedures   Medications Ordered in the ED - No data to display  Clinical Course as of 12/19/24 2130  Mon Dec 19, 2024  1958 CBC nl [JK]  1958 Basic metabolic panel(!) nl [JK]  1958 Troponin T, High Sensitivity nl [JK]  2002 CBC [JK]  2043 D-dimer, quantitative Dimer negative [JK]  2044 Troponin T, High Sensitivity Delta troponin normal [JK]    Clinical Course User Index [JK] Randol Simmonds, MD                                 Medical Decision Making Amount and/or Complexity of Data Reviewed Labs: ordered. Decision-making details documented in ED Course. Radiology: ordered.   Patient presented to the ED with recurrent episodes of shortness of breath.  Patient had another episode today.  His symptoms has been ongoing for several months.  Patient's wife thinks they could be stress related as he has had a lot of increased stress recently.  Patient had to respond to an emergency work.  Patient's wife is also undergoing cancer treatment.  He thinks that could be a contributing factor.  ED workup does not show any signs of acute cardiac ischemia.  There is no pneumonia.  No signs of CHF.  D-dimer negative doubt PE.  No clear etiology for symptoms certainly could be related to stress but I do think he would benefit from further workup, possible echocardiogram or cardiac monitoring as an outpatient.  Patient is comfortable with discharge and will follow-up with his PCP for further evaluation.      Final diagnoses:  Dyspnea, unspecified type    ED Discharge Orders     None          Randol Simmonds, MD 12/19/24 2130  "

## 2024-12-19 NOTE — ED Triage Notes (Signed)
 Pt states that he occasionally has shortness of breath. 8-9 times in the past year. He let his doctor know and he had a work up done a few weeks ago and everything checked out okay. He started having a episode about 2 hours ago and it has been persistent. He has never felt lightheaded with these episodes until today. He endorses that he was standing talking to someone and felt very fuzzy and lightheaded. He states that he sat and still felt dizzy. Denies chest pain.

## 2024-12-19 NOTE — Discharge Instructions (Signed)
 The tests in the ED did not show any signs of pneumonia, heart failure, blood clot or heart attacks.  Follow up with your doctor to discuss possible additional testing such as a cardiac monitor or echocardiogram.
